# Patient Record
Sex: Female | Born: 1950 | Race: White | Hispanic: No | Marital: Single | Smoking: Former smoker
Health system: Southern US, Community
[De-identification: ages and names within clinical notes are randomized; demographics above are authoritative.]

---

## 2000-09-06 ENCOUNTER — Encounter: Admission: RE | Admit: 2000-09-06 | Discharge: 2000-09-06 | Payer: Self-pay | Admitting: Internal Medicine

## 2000-09-06 ENCOUNTER — Other Ambulatory Visit: Admission: RE | Admit: 2000-09-06 | Discharge: 2000-09-06 | Payer: Self-pay | Admitting: Internal Medicine

## 2000-09-06 ENCOUNTER — Encounter: Payer: Self-pay | Admitting: Internal Medicine

## 2001-09-05 ENCOUNTER — Ambulatory Visit (HOSPITAL_COMMUNITY): Admission: RE | Admit: 2001-09-05 | Discharge: 2001-09-05 | Payer: Self-pay | Admitting: *Deleted

## 2001-09-05 ENCOUNTER — Encounter (INDEPENDENT_AMBULATORY_CARE_PROVIDER_SITE_OTHER): Payer: Self-pay | Admitting: Specialist

## 2002-11-15 ENCOUNTER — Other Ambulatory Visit: Admission: RE | Admit: 2002-11-15 | Discharge: 2002-11-15 | Payer: Self-pay | Admitting: Endocrinology

## 2002-12-25 ENCOUNTER — Ambulatory Visit (HOSPITAL_COMMUNITY): Admission: RE | Admit: 2002-12-25 | Discharge: 2002-12-25 | Payer: Self-pay | Admitting: *Deleted

## 2002-12-25 ENCOUNTER — Encounter (INDEPENDENT_AMBULATORY_CARE_PROVIDER_SITE_OTHER): Payer: Self-pay | Admitting: Specialist

## 2008-04-14 ENCOUNTER — Emergency Department (HOSPITAL_COMMUNITY): Admission: EM | Admit: 2008-04-14 | Discharge: 2008-04-14 | Payer: Self-pay | Admitting: Emergency Medicine

## 2011-04-15 NOTE — Op Note (Signed)
   NAME:  Veronica Small, Veronica Small                        ACCOUNT NO.:  [id number]   MEDICAL RECORD NO.:  [id number]                   PATIENT TYPE:  AMB   LOCATION:  ENDO                                 FACILITY:  [facility]   PHYSICIAN:  Georgiana Spinner, M.D.                 DATE OF BIRTH:  [birth year]   DATE OF PROCEDURE:  DATE OF DISCHARGE:                                 OPERATIVE REPORT   PROCEDURE:  Upper endoscopy.   INDICATIONS:  GERD.   ANESTHESIA:  Demerol 100 mg, Versed 10 mg.   DESCRIPTION OF PROCEDURE:  With the patient mildly sedated in the left  lateral decubitus position, the Olympus videoscopic endoscope was inserted  into the mouth and passed under direct vision through the esophagus which  appeared normal until it reached the distal esophagus and I could not tell  whether was Barrett's esophagus present but areas that were suspect were  photographed and biopsied.  We then entered into the stomach through a  hiatal hernia, the fundus, body, antrum, duodenal bulb, and second portion  of the duodenum all appeared normal.  From this point, the endoscope was  slowly withdrawn, taking circumferential views of the entire duodenal mucosa  until the endoscope was pulled into the stomach and placed in retroflexion,  viewing the stomach from below.  The endoscope was then straightened and  withdrawn, taking circumferential views of the remaining gastric and  esophageal mucosa.  The patient's vital signs and pulse oximetry remained  stable and the patient tolerated the procedure well without apparent  complications.   FINDINGS:  Questionable Barrett's esophagus, biopsied.   PLAN:  Await biopsy report, the patient will call me for results and follow  up with me as an outpatient.                                               Georgiana Spinner, M.D.    GMO/MEDQ  D:  12/25/2002  T:  12/25/2002  Job:  [id number]

## 2011-04-15 NOTE — Procedures (Signed)
[facility]  Patient:    JASMIN, WINBERRY Visit Number: [id number] MRN: [id number]          Service Type: END Location: ENDO Attending Physician:  Sabino Gasser Proc. Date: 09/05/01 Admit Date:  09/05/2001                             Procedure Report  PROCEDURE:  Upper endoscopy with biopsy.  INDICATIONS:  GERD.  ANESTHESIA:  Demerol 70 mg, Versed 8 mg.  DESCRIPTION OF PROCEDURE:  With the patient mildly sedated in the left lateral decubitus position, the Olympus videoscopic endoscope was inserted into the mouth and passed under direct vision through the esophagus.  The distal esophagus was approached, and there was a question of Barretts, as it had two areas that appeared like flame-like erythema extending cephalad in the esophagus, photographed and biopsied.  We entered into the stomach.  Fundus, body, antrum, duodenal bulb, and second portion of the duodenum were visualized, photographs taken.  From this point, the endoscope was slowly withdrawn, taking circumferential views of the entire duodenal mucosa until the endoscope then pulled back into the stomach and placed in retroflexion to view the stomach from below.  The endoscope was then straightened and pulled withdrawn, taking circumferential views of the gastric and esophageal mucosa, stopping in the stomach where some areas of erythema were photographed and biopsied to rule out gastritis.  The endoscope was withdrawn.  The patients vital signs and pulse oximeter remained stable.  The patient tolerated the procedure well without apparent complications.  FINDINGS:  Question of gastritis and question of Barretts esophagus.  Await biopsy report.  The patient will call me for the results and follow up with me as an outpatient.  Proceed to colonoscopy. Attending Physician:  Sabino Gasser DD:  09/05/01 TD:  09/05/01 Job: [id number]

## 2011-04-15 NOTE — Procedures (Signed)
[facility]  Patient:    Veronica Small, Veronica Small Visit Number: [id number] MRN: [id number]          Service Type: END Location: ENDO Attending Physician:  Sabino Gasser Proc. Date: 09/05/01 Admit Date:  09/05/2001                             Procedure Report  PROCEDURE:  Colonoscopy.  INDICATIONS:  Colon cancer screening.  ANESTHESIA:  Demerol 20, Versed 2 mg.  DESCRIPTION OF PROCEDURE:  With the patient mildly sedated in the left lateral decubitus position, the Olympus videoscopic colonoscope was inserted in the rectum and passed under direct vision to the cecum, identified by the ileocecal valve and appendiceal orifice.  We entered into the terminal ileum which also appeared normal and was photographed.  From this point, the colonoscope was slowly withdrawn taking circumferential views of the entire colonic mucosa, stopping in the rectum which appeared normal on direct and retroflexed view.  The endoscope was straightened and withdrawn.  The patients vital signs and pulse oximeter remained stable.  The patient tolerated the procedure well without apparent complications.  FINDINGS:  Essentially negative colonoscopic examination to the terminal ileum.  PLAN:  Possibly repeat in 5-10 years. Attending Physician:  Sabino Gasser DD:  09/05/01 TD:  09/05/01 Job: [id number]

## 2011-08-24 LAB — DIFFERENTIAL
Basophils Absolute: 0.1
Basophils Relative: 1
Eosinophils Absolute: 0
Eosinophils Relative: 1
Lymphocytes Relative: 34
Lymphs Abs: 1.7
Monocytes Absolute: 0.3
Monocytes Relative: 5
Neutro Abs: 3
Neutrophils Relative %: 59

## 2011-08-24 LAB — CBC
HCT: 37.3
Hemoglobin: 12.6
MCHC: 33.9
MCV: 83.4
Platelets: 198
RBC: 4.47
RDW: 12.8
WBC: 5.1

## 2011-08-24 LAB — POCT CARDIAC MARKERS
CKMB, poc: 1.4
CKMB, poc: <1 — ABNORMAL LOW
Myoglobin, poc: 34
Myoglobin, poc: 47.8
Operator id: 264031
Operator id: 290111
Troponin i, poc: 0.05
Troponin i, poc: <0.05

## 2011-08-24 LAB — POCT I-STAT, CHEM 8
BUN: 7
Calcium, Ion: 1.12
Chloride: 105
Creatinine, Ser: 1
Glucose, Bld: 89
HCT: 38
Hemoglobin: 12.9
Potassium: 4.3
Sodium: 140
TCO2: 28

## 2016-04-09 ENCOUNTER — Emergency Department (HOSPITAL_COMMUNITY): Payer: No Typology Code available for payment source

## 2016-04-09 ENCOUNTER — Emergency Department (HOSPITAL_COMMUNITY)
Admission: EM | Admit: 2016-04-09 | Discharge: 2016-04-09 | Disposition: A | Discharge status: Home / Self Care | Payer: No Typology Code available for payment source | Attending: Emergency Medicine | Admitting: Emergency Medicine

## 2016-04-09 ENCOUNTER — Encounter (HOSPITAL_COMMUNITY): Payer: Self-pay

## 2016-04-09 DIAGNOSIS — Z79899 Other long term (current) drug therapy: Secondary | ICD-10-CM | POA: Diagnosis not present

## 2016-04-09 DIAGNOSIS — J040 Acute laryngitis: Secondary | ICD-10-CM | POA: Diagnosis not present

## 2016-04-09 DIAGNOSIS — J189 Pneumonia, unspecified organism: Secondary | ICD-10-CM

## 2016-04-09 DIAGNOSIS — R05 Cough: Secondary | ICD-10-CM | POA: Diagnosis present

## 2016-04-09 DIAGNOSIS — Z87891 Personal history of nicotine dependence: Secondary | ICD-10-CM | POA: Diagnosis not present

## 2016-04-09 DIAGNOSIS — J159 Unspecified bacterial pneumonia: Secondary | ICD-10-CM | POA: Diagnosis not present

## 2016-04-09 LAB — CBC WITH DIFFERENTIAL/PLATELET
Basophils Absolute: 0 10*3/uL (ref 0.0–0.1)
Basophils Relative: 0 %
EOS ABS: 0 10*3/uL (ref 0.0–0.7)
EOS PCT: 0 %
HCT: 35.7 % — ABNORMAL LOW (ref 36.0–46.0)
HEMOGLOBIN: 11.1 g/dL — AB (ref 12.0–15.0)
LYMPHS ABS: 1.3 10*3/uL (ref 0.7–4.0)
Lymphocytes Relative: 16 %
MCH: 26.7 pg (ref 26.0–34.0)
MCHC: 31.1 g/dL (ref 30.0–36.0)
MCV: 85.8 fL (ref 78.0–100.0)
MONO ABS: 0.8 10*3/uL (ref 0.1–1.0)
MONOS PCT: 10 %
Neutro Abs: 6 10*3/uL (ref 1.7–7.7)
Neutrophils Relative %: 74 %
PLATELETS: 252 10*3/uL (ref 150–400)
RBC: 4.16 MIL/uL (ref 3.87–5.11)
RDW: 12.5 % (ref 11.5–15.5)
WBC: 8.2 10*3/uL (ref 4.0–10.5)

## 2016-04-09 LAB — BASIC METABOLIC PANEL
Anion gap: 12 (ref 5–15)
BUN: 7 mg/dL (ref 6–20)
CHLORIDE: 103 mmol/L (ref 101–111)
CO2: 24 mmol/L (ref 22–32)
CREATININE: 0.69 mg/dL (ref 0.44–1.00)
Calcium: 8.7 mg/dL — ABNORMAL LOW (ref 8.9–10.3)
GFR calc Af Amer: >60 mL/min (ref >60)
GFR calc non Af Amer: >60 mL/min (ref >60)
Glucose, Bld: 116 mg/dL — ABNORMAL HIGH (ref 65–99)
Potassium: 3.2 mmol/L — ABNORMAL LOW (ref 3.5–5.1)
SODIUM: 139 mmol/L (ref 135–145)

## 2016-04-09 MED ORDER — ALBUTEROL SULFATE HFA 108 (90 BASE) MCG/ACT IN AERS
2.0000 | INHALATION_SPRAY | Freq: Once | RESPIRATORY_TRACT | Status: AC
  Administered 2016-04-09: 2 via RESPIRATORY_TRACT
  Filled 2016-04-09: qty 6.7

## 2016-04-09 MED ORDER — LEVOFLOXACIN 500 MG PO TABS
500.0000 mg | ORAL_TABLET | Freq: Once | ORAL | Status: AC
  Administered 2016-04-09: 500 mg via ORAL
  Filled 2016-04-09: qty 1

## 2016-04-09 MED ORDER — ALBUTEROL SULFATE HFA 108 (90 BASE) MCG/ACT IN AERS: 2.0000 | INHALATION_SPRAY | RESPIRATORY_TRACT | Status: DC | PRN

## 2016-04-09 MED ORDER — DEXAMETHASONE SODIUM PHOSPHATE 10 MG/ML IJ SOLN
10.0000 mg | Freq: Once | INTRAMUSCULAR | Status: AC
  Administered 2016-04-09: 10 mg via INTRAMUSCULAR
  Filled 2016-04-09: qty 1

## 2016-04-09 MED ORDER — LEVOFLOXACIN 500 MG PO TABS: 500.0000 mg | ORAL_TABLET | Freq: Every day | ORAL | Status: DC

## 2016-04-09 NOTE — ED Notes (Signed)
Ambulated patient with pulse ox, NAD noted, maintained O2 sats above 94% throughout ambulation.

## 2016-04-09 NOTE — ED Notes (Signed)
Patient left at this time with all belongings. 

## 2016-04-09 NOTE — ED Provider Notes (Signed)
CSN: [id number]     Arrival date & time 04/09/16  0304 History  By signing my name below, I, Terrance Branch, attest that this documentation has been prepared under the direction and in the presence of Shon Batonourtney F Horton, MD. Electronically Signed: Evon Slackerrance Branch, ED Scribe. 04/09/2016. 3:41 AM.     Chief Complaint  Patient presents with  . Cough  . Shortness of Breath    The history is provided by the patient. No language interpreter was used.   HPI Comments: Veronica Small is a 65 y.o. female who presents to the Emergency Department complaining of dry cough onset 3 days prior. Pt reports associated SOB, sore throat, fever and voice change. Pt states she was seen by her PCP 3 days prior. States she was diagnosed with URI. States she was prescribed tessalon perles with no relief. Pt denies n/v/d. Pt states she did receive flu vaccination this year. Denies tobacco use. Denies Hx of asthma or COPD  History reviewed. No pertinent past medical history. History reviewed. No pertinent past surgical history. History reviewed. No pertinent family history. Social History  Substance Use Topics  . Smoking status: Former Smoker    Quit date: 04/09/2014  . Smokeless tobacco: None  . Alcohol Use: No   OB History    No data available      Review of Systems  Constitutional: Positive for fever.  HENT: Positive for sore throat and voice change.   Respiratory: Positive for cough and shortness of breath.   Gastrointestinal: Negative for nausea, vomiting and diarrhea.  All other systems reviewed and are negative.    Allergies  Review of patient's allergies indicates no known allergies.  Home Medications   Prior to Admission medications   Medication Sig Start Date End Date Taking? Authorizing Provider  ALPRAZolam Prudy Feeler(XANAX) 0.5 MG tablet Take 0.5 mg by mouth 3 (three) times daily.   Yes Historical Provider, MD  Cholecalciferol (VITAMIN D PO) Take 1 tablet by mouth daily.   Yes Historical  Provider, MD  Omega-3 Fatty Acids (FISH OIL PO) Take 1 tablet by mouth daily.   Yes Historical Provider, MD  omeprazole (PRILOSEC) 20 MG capsule Take 20 mg by mouth daily.   Yes Historical Provider, MD  pravastatin (PRAVACHOL) 40 MG tablet Take 40 mg by mouth daily.   Yes Historical Provider, MD  traZODone (DESYREL) 50 MG tablet Take 50 mg by mouth at bedtime.   Yes Historical Provider, MD  albuterol (PROVENTIL HFA;VENTOLIN HFA) 108 (90 Base) MCG/ACT inhaler Inhale 2 puffs into the lungs every 4 (four) hours as needed for wheezing or shortness of breath. 04/09/16   Shon Batonourtney F Horton, MD  levofloxacin (LEVAQUIN) 500 MG tablet Take 1 tablet (500 mg total) by mouth daily. 04/09/16   Shon Batonourtney F Horton, MD   BP 121/60 mmHg  Pulse 83  Temp(Src) 99.1 F (37.3 C) (Oral)  Resp 20  Ht 5\' 6"  (1.676 m)  Wt 160 lb (72.576 kg)  BMI 25.84 kg/m2  SpO2 95%   Physical Exam  Constitutional: She is oriented to person, place, and time. She appears well-developed and well-nourished. No distress.  HENT:  Head: Normocephalic and atraumatic.  Mouth/Throat: No oropharyngeal exudate.  Mild posterior oropharyngeal swelling, no significant erythema, uvula midline, worse voice  Eyes: Pupils are equal, round, and reactive to light.  Neck: Neck supple.  Cardiovascular: Normal rate, regular rhythm and normal heart sounds.   No murmur heard. Pulmonary/Chest: Effort normal and breath sounds normal. No stridor. No respiratory  distress. She has no wheezes.  Abdominal: Soft. Bowel sounds are normal. There is no tenderness. There is no rebound.  Musculoskeletal: She exhibits no edema.  Lymphadenopathy:    She has cervical adenopathy.  Neurological: She is alert and oriented to person, place, and time.  Skin: Skin is warm and dry.  Psychiatric: She has a normal mood and affect.  Nursing note and vitals reviewed.   ED Course  Procedures (including critical care time) DIAGNOSTIC STUDIES: Oxygen Saturation is 97% on  RA, normal by my interpretation.    COORDINATION OF CARE: 3:41 AM-Discussed treatment plan with pt at bedside and pt agreed to plan.     Labs Review Labs Reviewed  CBC WITH DIFFERENTIAL/PLATELET - Abnormal; Notable for the following:    Hemoglobin 11.1 (*)    HCT 35.7 (*)    All other components within normal limits  BASIC METABOLIC PANEL - Abnormal; Notable for the following:    Potassium 3.2 (*)    Glucose, Bld 116 (*)    Calcium 8.7 (*)    All other components within normal limits    Imaging Review Dg Chest 2 View  04/09/2016  CLINICAL DATA:  Nonproductive cough, fever, dyspnea for 1 week. EXAM: CHEST  2 VIEW COMPARISON:  02/15/2011 FINDINGS: There is patchy airspace consolidation in the right middle lobe, likely pneumonia. The left lung is clear. The pulmonary vasculature is normal. There is no pleural effusion. Hilar and mediastinal contours are unremarkable. IMPRESSION: Right middle lobe pneumonia. Followup PA and lateral chest X-ray is recommended in 3-4 weeks following trial of antibiotic therapy to ensure resolution and exclude underlying malignancy. Electronically Signed   By: Ellery Plunk M.D.   On: 04/09/2016 03:37      EKG Interpretation   Date/Time:  Saturday Apr 09 2016 03:16:56 EDT Ventricular Rate:  83 PR Interval:  140 QRS Duration: 85 QT Interval:  401 QTC Calculation: 471 R Axis:   53 Text Interpretation:  Sinus rhythm Baseline wander in lead(s) I II aVR  Confirmed by HORTON  MD, COURTNEY ([id number]) on 04/09/2016 3:22:42 AM      MDM   Final diagnoses:  Community acquired pneumonia  Laryngitis   Patient presents with persistent cough and hoarse voice. Nontoxic on exam. Afebrile. Vital signs reassuring. She does have mild tonsillar enlargement but no tonsillar exudate. Obvious laryngitis. Breath sounds are clear. Patient was given an inhaler given her history of smoking. She was also given Decadron for bronchospasm and mild tonsillar swelling. Chest  x-ray does show evidence of developing pneumonia. Patient was given a dose of Levaquin. She was able to ambulate and maintain pulse ox greater than 94%. Will discharge with outpatient management.  After history, exam, and medical workup I feel the patient has been appropriately medically screened and is safe for discharge home. Pertinent diagnoses were discussed with the patient. Patient was given return precautions.  I personally performed the services described in this documentation, which was scribed in my presence. The recorded information has been reviewed and is accurate.      Shon Baton, MD 04/09/16 [id number]

## 2016-04-09 NOTE — ED Notes (Signed)
Pt seen by PCP on Tuesday. Dx with viral infection. Pt complaining of shortness of breath and cough.

## 2016-04-09 NOTE — ED Notes (Signed)
Patient transported to X-ray 

## 2016-04-09 NOTE — Discharge Instructions (Signed)
Community-Acquired Pneumonia, Adult °Pneumonia is an infection of the lungs. One type of pneumonia can happen while a person is in a hospital. A different type can happen when a person is not in a hospital (community-acquired pneumonia). It is easy for this kind to spread from person to person. It can spread to you if you breathe near an infected person who coughs or sneezes. Some symptoms include: °· A dry cough. °· A wet (productive) cough. °· Fever. °· Sweating. °· Chest pain. °HOME CARE °· Take over-the-counter and prescription medicines only as told by your doctor. °· Only take cough medicine if you are losing sleep. °· If you were prescribed an antibiotic medicine, take it as told by your doctor. Do not stop taking the antibiotic even if you start to feel better. °· Sleep with your head and neck raised (elevated). You can do this by putting a few pillows under your head, or you can sleep in a recliner. °· Do not use tobacco products. These include cigarettes, chewing tobacco, and e-cigarettes. If you need help quitting, ask your doctor. °· Drink enough water to keep your pee (urine) clear or pale yellow. °A shot (vaccine) can help prevent pneumonia. Shots are often suggested for: °· People older than 65 years of age. °· People older than 65 years of age: °· Who are having cancer treatment. °· Who have long-term (chronic) lung disease. °· Who have problems with their body's defense system (immune system). °You may also prevent pneumonia if you take these actions: °· Get the flu (influenza) shot every year. °· Go to the dentist as often as told. °· Wash your hands often. If soap and water are not available, use hand sanitizer. °GET HELP IF: °· You have a fever. °· You lose sleep because your cough medicine does not help. °GET HELP RIGHT AWAY IF: °· You are short of breath and it gets worse. °· You have more chest pain. °· Your sickness gets worse. This is very serious if: °· You are an older adult. °· Your  body's defense system is weak. °· You cough up blood. °  °This information is not intended to replace advice given to you by your health care provider. Make sure you discuss any questions you have with your health care provider. °  °Document Released: 05/02/2008 Document Revised: 08/05/2015 Document Reviewed: 03/11/2015 °Elsevier Interactive Patient Education ©2016 [employer] ° °Laryngitis °Laryngitis is inflammation of your vocal cords. This causes hoarseness, coughing, loss of voice, sore throat, or a dry throat. Your vocal cords are two bands of muscles that are found in your throat. When you speak, these cords come together and vibrate. These vibrations come out through your mouth as sound. When your vocal cords are inflamed, your voice sounds different. °Laryngitis can be temporary (acute) or long-term (chronic). Most cases of acute laryngitis improve with time. Chronic laryngitis is laryngitis that lasts for more than three weeks. °CAUSES °Acute laryngitis may be caused by: °· A viral infection. °· Lots of talking, yelling, or singing. This is also called vocal strain. °· Bacterial infections. °Chronic laryngitis may be caused by: °· Vocal strain. °· Injury to your vocal cords. °· Acid reflux (gastroesophageal reflux disease or GERD). °· Allergies. °· Sinus infection. °· Smoking. °· Alcohol abuse. °· Breathing in chemicals or dust. °· Growths on the vocal cords. °RISK FACTORS °Risk factors for laryngitis include: °· Smoking. °· Alcohol abuse. °· Having allergies. °SIGNS AND SYMPTOMS °Symptoms of laryngitis may include: °· Low, hoarse   voice. °· Loss of voice. °· Dry cough. °· Sore throat. °· Stuffy nose. °DIAGNOSIS °Laryngitis may be diagnosed by: °· Physical exam. °· Throat culture. °· Blood test. °· Laryngoscopy. This procedure allows your health care provider to look at your vocal cords with a mirror or viewing tube. °TREATMENT °Treatment for laryngitis depends on what is causing it. Usually, treatment  involves resting your voice and using medicines to soothe your throat. However, if your laryngitis is caused by a bacterial infection, you may need to take antibiotic medicine. If your laryngitis is caused by a growth, you may need to have a procedure to remove it. °HOME CARE INSTRUCTIONS °· Drink enough fluid to keep your urine clear or pale yellow. °· Breathe in moist air. Use a humidifier if you live in a dry climate. °· Take medicines only as directed by your health care provider. °· If you were prescribed an antibiotic medicine, finish it all even if you start to feel better. °· Do not smoke cigarettes or electronic cigarettes. If you need help quitting, ask your health care provider. °· Talk as little as possible. Also avoid whispering, which can cause vocal strain. °· Write instead of talking. Do this until your voice is back to normal. °SEEK MEDICAL CARE IF: °· You have a fever. °· You have increasing pain. °· You have difficulty swallowing. °SEEK IMMEDIATE MEDICAL CARE IF: °· You cough up blood. °· You have trouble breathing. °  °This information is not intended to replace advice given to you by your health care provider. Make sure you discuss any questions you have with your health care provider. °  °Document Released: 11/14/2005 Document Revised: 12/05/2014 Document Reviewed: 04/29/2014 °Elsevier Interactive Patient Education ©2016 Elsevier Inc. ° °

## 2016-04-09 NOTE — ED Notes (Signed)
MD at bedside. 

## 2016-05-12 ENCOUNTER — Ambulatory Visit
Admission: RE | Admit: 2016-05-12 | Discharge: 2016-05-12 | Disposition: A | Discharge status: Home / Self Care | Payer: 59 | Source: Ambulatory Visit | Attending: Internal Medicine | Admitting: Internal Medicine

## 2016-05-12 ENCOUNTER — Other Ambulatory Visit: Payer: Self-pay | Admitting: Internal Medicine

## 2016-05-12 DIAGNOSIS — J189 Pneumonia, unspecified organism: Secondary | ICD-10-CM

## 2017-01-06 ENCOUNTER — Other Ambulatory Visit: Payer: Self-pay | Admitting: Obstetrics & Gynecology

## 2017-01-06 ENCOUNTER — Other Ambulatory Visit (HOSPITAL_COMMUNITY)
Admission: RE | Admit: 2017-01-06 | Discharge: 2017-01-06 | Disposition: A | Discharge status: Home / Self Care | Payer: No Typology Code available for payment source | Source: Ambulatory Visit | Attending: Obstetrics & Gynecology | Admitting: Obstetrics & Gynecology

## 2017-01-06 DIAGNOSIS — Z01419 Encounter for gynecological examination (general) (routine) without abnormal findings: Secondary | ICD-10-CM | POA: Diagnosis present

## 2017-01-06 DIAGNOSIS — Z1151 Encounter for screening for human papillomavirus (HPV): Secondary | ICD-10-CM | POA: Insufficient documentation

## 2017-01-10 LAB — CYTOLOGY - PAP
DIAGNOSIS: NEGATIVE
HPV: NOT DETECTED

## 2017-11-09 ENCOUNTER — Ambulatory Visit
Admission: RE | Admit: 2017-11-09 | Discharge: 2017-11-09 | Disposition: A | Discharge status: Home / Self Care | Payer: No Typology Code available for payment source | Source: Ambulatory Visit | Attending: Internal Medicine | Admitting: Internal Medicine

## 2017-11-09 ENCOUNTER — Other Ambulatory Visit: Payer: Self-pay | Admitting: Internal Medicine

## 2017-11-09 DIAGNOSIS — R059 Cough, unspecified: Secondary | ICD-10-CM

## 2017-11-09 DIAGNOSIS — R05 Cough: Secondary | ICD-10-CM

## 2018-07-24 ENCOUNTER — Other Ambulatory Visit: Payer: Self-pay | Admitting: Internal Medicine

## 2018-07-24 ENCOUNTER — Ambulatory Visit
Admission: RE | Admit: 2018-07-24 | Discharge: 2018-07-24 | Disposition: A | Discharge status: Home / Self Care | Payer: No Typology Code available for payment source | Source: Ambulatory Visit | Attending: Internal Medicine | Admitting: Internal Medicine

## 2018-07-24 DIAGNOSIS — R05 Cough: Secondary | ICD-10-CM

## 2018-07-24 DIAGNOSIS — R059 Cough, unspecified: Secondary | ICD-10-CM

## 2018-12-10 ENCOUNTER — Other Ambulatory Visit: Payer: Self-pay | Admitting: Internal Medicine

## 2018-12-10 DIAGNOSIS — E2839 Other primary ovarian failure: Secondary | ICD-10-CM

## 2018-12-10 DIAGNOSIS — Z1231 Encounter for screening mammogram for malignant neoplasm of breast: Secondary | ICD-10-CM

## 2019-02-05 ENCOUNTER — Inpatient Hospital Stay: Admission: RE | Admit: 2019-02-05 | Payer: No Typology Code available for payment source | Source: Ambulatory Visit

## 2019-02-05 ENCOUNTER — Other Ambulatory Visit: Payer: No Typology Code available for payment source

## 2019-12-30 ENCOUNTER — Other Ambulatory Visit: Payer: Self-pay | Admitting: Internal Medicine

## 2019-12-30 DIAGNOSIS — E2839 Other primary ovarian failure: Secondary | ICD-10-CM

## 2020-01-10 ENCOUNTER — Ambulatory Visit: Payer: No Typology Code available for payment source | Attending: Internal Medicine

## 2020-01-10 DIAGNOSIS — Z23 Encounter for immunization: Secondary | ICD-10-CM | POA: Insufficient documentation

## 2020-01-10 NOTE — Progress Notes (Signed)
   Covid-19 Vaccination Clinic  Name:  Veronica Small    MRN: [id number] DOB: 29-May-1951  01/10/2020  [name] was observed post Covid-19 immunization for 15 minutes without incidence. She was provided with Vaccine Information Sheet and instruction to access the V-Safe system.   [name] was instructed to call 911 with any severe reactions post vaccine: Marland Kitchen Difficulty breathing  . Swelling of your face and throat  . A fast heartbeat  . A bad rash all over your body  . Dizziness and weakness    Immunizations Administered    Name Date Dose VIS Date Route   Pfizer COVID-19 Vaccine 01/10/2020  3:54 PM 0.3 mL 11/08/2019 Intramuscular   Manufacturer: [employer], [employer]   Lot: [id number]   NDC: [address]-8

## 2020-01-27 ENCOUNTER — Other Ambulatory Visit: Payer: No Typology Code available for payment source

## 2020-02-03 ENCOUNTER — Ambulatory Visit: Payer: No Typology Code available for payment source | Attending: Internal Medicine

## 2020-02-03 DIAGNOSIS — Z23 Encounter for immunization: Secondary | ICD-10-CM | POA: Insufficient documentation

## 2020-02-03 NOTE — Progress Notes (Signed)
   Covid-19 Vaccination Clinic  Name:  Veronica Small    MRN: [id number] DOB: 06/26/1951  02/03/2020  [name] was observed post Covid-19 immunization for 15 minutes without incident. She was provided with Vaccine Information Sheet and instruction to access the V-Safe system.   [name] was instructed to call 911 with any severe reactions post vaccine: Marland Kitchen Difficulty breathing  . Swelling of face and throat  . A fast heartbeat  . A bad rash all over body  . Dizziness and weakness   Immunizations Administered    Name Date Dose VIS Date Route   Pfizer COVID-19 Vaccine 02/03/2020  8:29 AM 0.3 mL 11/08/2019 Intramuscular   Manufacturer: [employer], [employer]   Lot: [id number]   NDC: [address]-7

## 2021-01-05 ENCOUNTER — Other Ambulatory Visit: Payer: Self-pay | Admitting: Internal Medicine

## 2021-01-05 DIAGNOSIS — E2839 Other primary ovarian failure: Secondary | ICD-10-CM

## 2021-01-05 DIAGNOSIS — Z1231 Encounter for screening mammogram for malignant neoplasm of breast: Secondary | ICD-10-CM

## 2021-06-09 ENCOUNTER — Other Ambulatory Visit: Payer: No Typology Code available for payment source

## 2021-06-09 ENCOUNTER — Ambulatory Visit: Payer: No Typology Code available for payment source

## 2021-11-23 ENCOUNTER — Ambulatory Visit: Payer: No Typology Code available for payment source

## 2021-11-23 ENCOUNTER — Inpatient Hospital Stay: Admission: RE | Admit: 2021-11-23 | Payer: No Typology Code available for payment source | Source: Ambulatory Visit

## 2021-12-20 ENCOUNTER — Other Ambulatory Visit: Payer: Self-pay

## 2021-12-20 ENCOUNTER — Inpatient Hospital Stay (HOSPITAL_COMMUNITY)
Admission: EM | Admit: 2021-12-20 | Discharge: 2021-12-25 | DRG: 853 | Disposition: A | Discharge status: Home / Self Care | Payer: No Typology Code available for payment source | Attending: Internal Medicine | Admitting: Internal Medicine

## 2021-12-20 DIAGNOSIS — I6521 Occlusion and stenosis of right carotid artery: Secondary | ICD-10-CM | POA: Diagnosis present

## 2021-12-20 DIAGNOSIS — W5503XA Scratched by cat, initial encounter: Secondary | ICD-10-CM

## 2021-12-20 DIAGNOSIS — Z823 Family history of stroke: Secondary | ICD-10-CM

## 2021-12-20 DIAGNOSIS — F419 Anxiety disorder, unspecified: Secondary | ICD-10-CM | POA: Diagnosis present

## 2021-12-20 DIAGNOSIS — F32A Depression, unspecified: Secondary | ICD-10-CM | POA: Diagnosis present

## 2021-12-20 DIAGNOSIS — K219 Gastro-esophageal reflux disease without esophagitis: Secondary | ICD-10-CM | POA: Diagnosis present

## 2021-12-20 DIAGNOSIS — Z7902 Long term (current) use of antithrombotics/antiplatelets: Secondary | ICD-10-CM

## 2021-12-20 DIAGNOSIS — D62 Acute posthemorrhagic anemia: Secondary | ICD-10-CM | POA: Diagnosis not present

## 2021-12-20 DIAGNOSIS — I6523 Occlusion and stenosis of bilateral carotid arteries: Secondary | ICD-10-CM | POA: Diagnosis present

## 2021-12-20 DIAGNOSIS — A419 Sepsis, unspecified organism: Secondary | ICD-10-CM | POA: Diagnosis not present

## 2021-12-20 DIAGNOSIS — Z87891 Personal history of nicotine dependence: Secondary | ICD-10-CM

## 2021-12-20 DIAGNOSIS — R29703 NIHSS score 3: Secondary | ICD-10-CM | POA: Diagnosis present

## 2021-12-20 DIAGNOSIS — I6340 Cerebral infarction due to embolism of unspecified cerebral artery: Secondary | ICD-10-CM | POA: Insufficient documentation

## 2021-12-20 DIAGNOSIS — I772 Rupture of artery: Secondary | ICD-10-CM | POA: Diagnosis present

## 2021-12-20 DIAGNOSIS — E8721 Acute metabolic acidosis: Secondary | ICD-10-CM | POA: Diagnosis present

## 2021-12-20 DIAGNOSIS — G8194 Hemiplegia, unspecified affecting left nondominant side: Secondary | ICD-10-CM | POA: Diagnosis present

## 2021-12-20 DIAGNOSIS — G459 Transient cerebral ischemic attack, unspecified: Secondary | ICD-10-CM

## 2021-12-20 DIAGNOSIS — E785 Hyperlipidemia, unspecified: Secondary | ICD-10-CM | POA: Diagnosis present

## 2021-12-20 DIAGNOSIS — Z23 Encounter for immunization: Secondary | ICD-10-CM

## 2021-12-20 DIAGNOSIS — Z7982 Long term (current) use of aspirin: Secondary | ICD-10-CM

## 2021-12-20 DIAGNOSIS — Z8249 Family history of ischemic heart disease and other diseases of the circulatory system: Secondary | ICD-10-CM

## 2021-12-20 DIAGNOSIS — I63411 Cerebral infarction due to embolism of right middle cerebral artery: Secondary | ICD-10-CM | POA: Diagnosis present

## 2021-12-20 DIAGNOSIS — Z79899 Other long term (current) drug therapy: Secondary | ICD-10-CM

## 2021-12-20 DIAGNOSIS — I724 Aneurysm of artery of lower extremity: Secondary | ICD-10-CM | POA: Diagnosis not present

## 2021-12-20 DIAGNOSIS — R739 Hyperglycemia, unspecified: Secondary | ICD-10-CM | POA: Diagnosis present

## 2021-12-20 DIAGNOSIS — I10 Essential (primary) hypertension: Secondary | ICD-10-CM | POA: Diagnosis present

## 2021-12-20 DIAGNOSIS — Z20822 Contact with and (suspected) exposure to covid-19: Secondary | ICD-10-CM | POA: Diagnosis present

## 2021-12-20 DIAGNOSIS — L03113 Cellulitis of right upper limb: Secondary | ICD-10-CM | POA: Diagnosis present

## 2021-12-20 NOTE — ED Triage Notes (Signed)
The patient presents from home due to possible reaction to medication. About a week ago the patient was scratched by her cat. Since then her right forearm has become warm to touch, red and weeping. Today her MD started here on an antibiotic. After her second dose of the antibiotic, she suffered from itching, a near syncopal episode, tremors, slurred speech and facial droop. She believes the slurred speech and facial droop was due to not having her teeth in.    EMS vitals: 260 CBG 92 HR 98% RM 132/76

## 2021-12-21 ENCOUNTER — Inpatient Hospital Stay (HOSPITAL_COMMUNITY): Payer: No Typology Code available for payment source

## 2021-12-21 ENCOUNTER — Emergency Department (HOSPITAL_COMMUNITY): Payer: No Typology Code available for payment source

## 2021-12-21 ENCOUNTER — Encounter (HOSPITAL_COMMUNITY): Payer: Self-pay | Admitting: Emergency Medicine

## 2021-12-21 DIAGNOSIS — Z7902 Long term (current) use of antithrombotics/antiplatelets: Secondary | ICD-10-CM | POA: Diagnosis not present

## 2021-12-21 DIAGNOSIS — I639 Cerebral infarction, unspecified: Secondary | ICD-10-CM | POA: Diagnosis not present

## 2021-12-21 DIAGNOSIS — L03113 Cellulitis of right upper limb: Secondary | ICD-10-CM | POA: Diagnosis not present

## 2021-12-21 DIAGNOSIS — Z8249 Family history of ischemic heart disease and other diseases of the circulatory system: Secondary | ICD-10-CM | POA: Diagnosis not present

## 2021-12-21 DIAGNOSIS — Z23 Encounter for immunization: Secondary | ICD-10-CM | POA: Diagnosis not present

## 2021-12-21 DIAGNOSIS — R739 Hyperglycemia, unspecified: Secondary | ICD-10-CM | POA: Diagnosis not present

## 2021-12-21 DIAGNOSIS — D62 Acute posthemorrhagic anemia: Secondary | ICD-10-CM | POA: Diagnosis not present

## 2021-12-21 DIAGNOSIS — I772 Rupture of artery: Secondary | ICD-10-CM | POA: Diagnosis not present

## 2021-12-21 DIAGNOSIS — A419 Sepsis, unspecified organism: Secondary | ICD-10-CM | POA: Diagnosis present

## 2021-12-21 DIAGNOSIS — I10 Essential (primary) hypertension: Secondary | ICD-10-CM | POA: Diagnosis not present

## 2021-12-21 DIAGNOSIS — I6389 Other cerebral infarction: Secondary | ICD-10-CM | POA: Diagnosis not present

## 2021-12-21 DIAGNOSIS — R29703 NIHSS score 3: Secondary | ICD-10-CM | POA: Diagnosis not present

## 2021-12-21 DIAGNOSIS — Z823 Family history of stroke: Secondary | ICD-10-CM | POA: Diagnosis not present

## 2021-12-21 DIAGNOSIS — W5503XA Scratched by cat, initial encounter: Secondary | ICD-10-CM | POA: Diagnosis not present

## 2021-12-21 DIAGNOSIS — F32A Depression, unspecified: Secondary | ICD-10-CM | POA: Diagnosis not present

## 2021-12-21 DIAGNOSIS — Z87891 Personal history of nicotine dependence: Secondary | ICD-10-CM | POA: Diagnosis not present

## 2021-12-21 DIAGNOSIS — E785 Hyperlipidemia, unspecified: Secondary | ICD-10-CM | POA: Diagnosis not present

## 2021-12-21 DIAGNOSIS — E7800 Pure hypercholesterolemia, unspecified: Secondary | ICD-10-CM | POA: Diagnosis not present

## 2021-12-21 DIAGNOSIS — Z20822 Contact with and (suspected) exposure to covid-19: Secondary | ICD-10-CM | POA: Diagnosis not present

## 2021-12-21 DIAGNOSIS — K219 Gastro-esophageal reflux disease without esophagitis: Secondary | ICD-10-CM | POA: Diagnosis not present

## 2021-12-21 DIAGNOSIS — I724 Aneurysm of artery of lower extremity: Secondary | ICD-10-CM | POA: Diagnosis not present

## 2021-12-21 DIAGNOSIS — I729 Aneurysm of unspecified site: Secondary | ICD-10-CM | POA: Diagnosis not present

## 2021-12-21 DIAGNOSIS — G8194 Hemiplegia, unspecified affecting left nondominant side: Secondary | ICD-10-CM | POA: Diagnosis not present

## 2021-12-21 DIAGNOSIS — Z79899 Other long term (current) drug therapy: Secondary | ICD-10-CM | POA: Diagnosis not present

## 2021-12-21 DIAGNOSIS — I6521 Occlusion and stenosis of right carotid artery: Secondary | ICD-10-CM | POA: Diagnosis not present

## 2021-12-21 DIAGNOSIS — E8721 Acute metabolic acidosis: Secondary | ICD-10-CM | POA: Diagnosis not present

## 2021-12-21 DIAGNOSIS — F419 Anxiety disorder, unspecified: Secondary | ICD-10-CM | POA: Diagnosis not present

## 2021-12-21 DIAGNOSIS — I6523 Occlusion and stenosis of bilateral carotid arteries: Secondary | ICD-10-CM | POA: Diagnosis not present

## 2021-12-21 DIAGNOSIS — I63131 Cerebral infarction due to embolism of right carotid artery: Secondary | ICD-10-CM | POA: Diagnosis not present

## 2021-12-21 DIAGNOSIS — I6340 Cerebral infarction due to embolism of unspecified cerebral artery: Secondary | ICD-10-CM | POA: Insufficient documentation

## 2021-12-21 DIAGNOSIS — I63411 Cerebral infarction due to embolism of right middle cerebral artery: Secondary | ICD-10-CM | POA: Diagnosis not present

## 2021-12-21 DIAGNOSIS — Z7982 Long term (current) use of aspirin: Secondary | ICD-10-CM | POA: Diagnosis not present

## 2021-12-21 LAB — URINALYSIS, ROUTINE W REFLEX MICROSCOPIC
Bilirubin Urine: NEGATIVE
Glucose, UA: 250 mg/dL — AB
Hgb urine dipstick: NEGATIVE
Ketones, ur: NEGATIVE mg/dL
Leukocytes,Ua: NEGATIVE
Nitrite: NEGATIVE
Specific Gravity, Urine: >1.03 — ABNORMAL HIGH (ref 1.005–1.030)
pH: 6 (ref 5.0–8.0)

## 2021-12-21 LAB — ECHOCARDIOGRAM COMPLETE
AR max vel: 2.02 cm2
AV Area VTI: 2.01 cm2
AV Area mean vel: 2.08 cm2
AV Mean grad: 4.5 mmHg
AV Peak grad: 8.4 mmHg
Ao pk vel: 1.45 m/s
Area-P 1/2: 5.09 cm2
Height: 66 in
S' Lateral: 3.1 cm
Weight: 2832 oz

## 2021-12-21 LAB — COMPREHENSIVE METABOLIC PANEL
ALT: 18 U/L (ref 0–44)
AST: 27 U/L (ref 15–41)
Albumin: 3.4 g/dL — ABNORMAL LOW (ref 3.5–5.0)
Alkaline Phosphatase: 77 U/L (ref 38–126)
Anion gap: 9 (ref 5–15)
BUN: 14 mg/dL (ref 8–23)
CO2: 21 mmol/L — ABNORMAL LOW (ref 22–32)
Calcium: 8 mg/dL — ABNORMAL LOW (ref 8.9–10.3)
Chloride: 107 mmol/L (ref 98–111)
Creatinine, Ser: 1.11 mg/dL — ABNORMAL HIGH (ref 0.44–1.00)
GFR, Estimated: 53 mL/min — ABNORMAL LOW (ref >60)
Glucose, Bld: 175 mg/dL — ABNORMAL HIGH (ref 70–99)
Potassium: 3.8 mmol/L (ref 3.5–5.1)
Sodium: 137 mmol/L (ref 135–145)
Total Bilirubin: 0.5 mg/dL (ref 0.3–1.2)
Total Protein: 6.2 g/dL — ABNORMAL LOW (ref 6.5–8.1)

## 2021-12-21 LAB — LACTIC ACID, PLASMA
Lactic Acid, Venous: 3.6 mmol/L (ref 0.5–1.9)
Lactic Acid, Venous: 2.8 mmol/L (ref 0.5–1.9)
Lactic Acid, Venous: 1.1 mmol/L (ref 0.5–1.9)
Lactic Acid, Venous: 1.2 mmol/L (ref 0.5–1.9)

## 2021-12-21 LAB — DIFFERENTIAL
Abs Immature Granulocytes: 0.05 10*3/uL (ref 0.00–0.07)
Basophils Absolute: 0 10*3/uL (ref 0.0–0.1)
Basophils Relative: 0 %
Eosinophils Absolute: 0 10*3/uL (ref 0.0–0.5)
Eosinophils Relative: 0 %
Immature Granulocytes: 0 %
Lymphocytes Relative: 4 %
Lymphs Abs: 0.6 10*3/uL — ABNORMAL LOW (ref 0.7–4.0)
Monocytes Absolute: 0.7 10*3/uL (ref 0.1–1.0)
Monocytes Relative: 5 %
Neutro Abs: 12.6 10*3/uL — ABNORMAL HIGH (ref 1.7–7.7)
Neutrophils Relative %: 91 %

## 2021-12-21 LAB — CBC
HCT: 36.8 % (ref 36.0–46.0)
Hemoglobin: 11.6 g/dL — ABNORMAL LOW (ref 12.0–15.0)
MCH: 27.5 pg (ref 26.0–34.0)
MCHC: 31.5 g/dL (ref 30.0–36.0)
MCV: 87.2 fL (ref 80.0–100.0)
Platelets: 210 10*3/uL (ref 150–400)
RBC: 4.22 MIL/uL (ref 3.87–5.11)
RDW: 12.3 % (ref 11.5–15.5)
WBC: 13.9 10*3/uL — ABNORMAL HIGH (ref 4.0–10.5)
nRBC: 0 % (ref 0.0–0.2)

## 2021-12-21 LAB — APTT: aPTT: 22 seconds — ABNORMAL LOW (ref 24–36)

## 2021-12-21 LAB — RAPID URINE DRUG SCREEN, HOSP PERFORMED
Amphetamines: POSITIVE — AB
Barbiturates: NOT DETECTED
Benzodiazepines: POSITIVE — AB
Cocaine: NOT DETECTED
Opiates: NOT DETECTED
Tetrahydrocannabinol: NOT DETECTED

## 2021-12-21 LAB — PROTIME-INR
INR: 1.1 (ref 0.8–1.2)
Prothrombin Time: 13.9 seconds (ref 11.4–15.2)

## 2021-12-21 LAB — RESP PANEL BY RT-PCR (FLU A&B, COVID) ARPGX2
Influenza A by PCR: NEGATIVE
Influenza B by PCR: NEGATIVE
SARS Coronavirus 2 by RT PCR: NEGATIVE

## 2021-12-21 LAB — URINALYSIS, MICROSCOPIC (REFLEX)
Bacteria, UA: NONE SEEN
RBC / HPF: NONE SEEN RBC/hpf (ref 0–5)

## 2021-12-21 LAB — HIV ANTIBODY (ROUTINE TESTING W REFLEX): HIV Screen 4th Generation wRfx: NONREACTIVE

## 2021-12-21 LAB — ETHANOL: Alcohol, Ethyl (B): <10 mg/dL (ref <10)

## 2021-12-21 LAB — CBG MONITORING, ED: Glucose-Capillary: 133 mg/dL — ABNORMAL HIGH (ref 70–99)

## 2021-12-21 MED ORDER — CLOPIDOGREL BISULFATE 300 MG PO TABS
300.0000 mg | ORAL_TABLET | Freq: Once | ORAL | Status: AC
  Administered 2021-12-21: 10:00:00 300 mg via ORAL
  Filled 2021-12-21: qty 1

## 2021-12-21 MED ORDER — LACTATED RINGERS IV BOLUS (SEPSIS)
1000.0000 mL | Freq: Once | INTRAVENOUS | Status: AC
  Administered 2021-12-21: 02:00:00 1000 mL via INTRAVENOUS

## 2021-12-21 MED ORDER — STROKE: EARLY STAGES OF RECOVERY BOOK
Freq: Once | Status: AC
  Administered 2021-12-21: 18:00:00 1
  Filled 2021-12-21: qty 1

## 2021-12-21 MED ORDER — HYDROCODONE-ACETAMINOPHEN 5-325 MG PO TABS
1.0000 | ORAL_TABLET | ORAL | Status: DC | PRN
  Administered 2021-12-23: 2 via ORAL
  Filled 2021-12-21: qty 2

## 2021-12-21 MED ORDER — IOHEXOL 350 MG/ML SOLN
80.0000 mL | Freq: Once | INTRAVENOUS | Status: AC | PRN
  Administered 2021-12-21: 06:00:00 80 mL via INTRAVENOUS

## 2021-12-21 MED ORDER — ASPIRIN EC 81 MG PO TBEC
81.0000 mg | DELAYED_RELEASE_TABLET | Freq: Every day | ORAL | Status: DC
  Administered 2021-12-21 – 2021-12-23 (×3): 81 mg via ORAL
  Filled 2021-12-21 (×3): qty 1

## 2021-12-21 MED ORDER — FLUOXETINE HCL 20 MG PO CAPS
40.0000 mg | ORAL_CAPSULE | Freq: Every day | ORAL | Status: DC
  Administered 2021-12-21 – 2021-12-25 (×5): 40 mg via ORAL
  Filled 2021-12-21 (×6): qty 2

## 2021-12-21 MED ORDER — ACETAMINOPHEN 650 MG RE SUPP: 650.0000 mg | Freq: Four times a day (QID) | RECTAL | Status: DC | PRN

## 2021-12-21 MED ORDER — CIPROFLOXACIN IN D5W 400 MG/200ML IV SOLN
400.0000 mg | Freq: Once | INTRAVENOUS | Status: AC
  Administered 2021-12-21: 02:00:00 400 mg via INTRAVENOUS
  Filled 2021-12-21: qty 200

## 2021-12-21 MED ORDER — SODIUM CHLORIDE 0.9 % IV SOLN
3.0000 g | Freq: Three times a day (TID) | INTRAVENOUS | Status: DC
  Administered 2021-12-21: 07:00:00 3 g via INTRAVENOUS
  Filled 2021-12-21: qty 8

## 2021-12-21 MED ORDER — CLOPIDOGREL BISULFATE 75 MG PO TABS
75.0000 mg | ORAL_TABLET | Freq: Every day | ORAL | Status: DC
  Administered 2021-12-22: 09:00:00 75 mg via ORAL
  Filled 2021-12-21: qty 1

## 2021-12-21 MED ORDER — LACTATED RINGERS IV SOLN: INTRAVENOUS | Status: AC

## 2021-12-21 MED ORDER — PNEUMOCOCCAL VAC POLYVALENT 25 MCG/0.5ML IJ INJ
0.5000 mL | INJECTION | INTRAMUSCULAR | Status: AC
  Administered 2021-12-22: 11:00:00 0.5 mL via INTRAMUSCULAR
  Filled 2021-12-21: qty 0.5

## 2021-12-21 MED ORDER — SODIUM CHLORIDE 0.9 % IV SOLN
100.0000 mg | Freq: Two times a day (BID) | INTRAVENOUS | Status: DC
  Administered 2021-12-21 – 2021-12-25 (×8): 100 mg via INTRAVENOUS
  Filled 2021-12-21 (×11): qty 100

## 2021-12-21 MED ORDER — PRAVASTATIN SODIUM 40 MG PO TABS
40.0000 mg | ORAL_TABLET | Freq: Every day | ORAL | Status: DC
  Administered 2021-12-21 – 2021-12-25 (×5): 40 mg via ORAL
  Filled 2021-12-21 (×5): qty 1

## 2021-12-21 MED ORDER — SULFAMETHOXAZOLE-TRIMETHOPRIM 800-160 MG PO TABS
1.0000 | ORAL_TABLET | Freq: Once | ORAL | Status: AC
  Administered 2021-12-21: 02:00:00 1 via ORAL
  Filled 2021-12-21: qty 1

## 2021-12-21 MED ORDER — ACETAMINOPHEN 325 MG PO TABS: 650.0000 mg | ORAL_TABLET | Freq: Four times a day (QID) | ORAL | Status: DC | PRN

## 2021-12-21 MED ORDER — ALPRAZOLAM 0.5 MG PO TABS
0.5000 mg | ORAL_TABLET | Freq: Three times a day (TID) | ORAL | Status: DC
  Administered 2021-12-21 – 2021-12-25 (×12): 0.5 mg via ORAL
  Filled 2021-12-21 (×12): qty 1

## 2021-12-21 MED ORDER — PANTOPRAZOLE SODIUM 40 MG PO TBEC
40.0000 mg | DELAYED_RELEASE_TABLET | Freq: Every day | ORAL | Status: DC
  Administered 2021-12-21 – 2021-12-25 (×5): 40 mg via ORAL
  Filled 2021-12-21 (×5): qty 1

## 2021-12-21 MED ORDER — LACTATED RINGERS IV BOLUS (SEPSIS)
250.0000 mL | Freq: Once | INTRAVENOUS | Status: AC
  Administered 2021-12-21: 02:00:00 250 mL via INTRAVENOUS

## 2021-12-21 MED ORDER — SODIUM CHLORIDE (PF) 0.9 % IJ SOLN
INTRAMUSCULAR | Status: AC
  Filled 2021-12-21: qty 50

## 2021-12-21 NOTE — ED Notes (Signed)
Pt back from MRI at this time

## 2021-12-21 NOTE — ED Notes (Signed)
Pharmacy paged regarding pt's Vibramycin.

## 2021-12-21 NOTE — ED Notes (Signed)
Attempted to [facility], [facility] unable to take report at this time. Will re-attempt.  °

## 2021-12-21 NOTE — H&P (Addendum)
History and Physical    Veronica Small GXQ:[id number] DOB: February 07, 1951 DOA: 12/20/2021  PCP: Renford Dills, MD  Patient coming from: Home  Chief Complaint: weakness  HPI: Veronica Small is a 71 y.o. female with medical history significant of anxiety/depression, HLD, GERD. Presenting with LUE weakness. She reports that she was scratched by her cat a couple of days ago. She developed a rash and pain at the wound site, so she went to see her PCP. She was given keflex. She believes she had a reaction to the keflex. Shortly after taking it, she felt weak all over. She felt like her left arm was numb and heavy. She would it difficult to move overall. She became concerned and came to the ED for help.   ED Course: Teleneuro was consulted. Rec'd admission to [facility] and stroke w/u. She was started on cipro, bactrim and unasyn for sepsis. TRH was called for admission.  Review of Systems: Review of systems is otherwise negative for all not mentioned in HPI.   PMHx Anxiety/Depression GERD HLD  PSHx C-section  SocHx  reports that she quit smoking about 7 years ago. Her smoking use included cigarettes. She does not have any smokeless tobacco history on file. She reports that she does not drink alcohol and does not use drugs.  No Known Allergies  FamHx History reviewed. No pertinent family history.  Prior to Admission medications   Medication Sig Start Date End Date Taking? Authorizing Provider  ALPRAZolam Prudy Feeler) 0.5 MG tablet Take 0.5 mg by mouth 3 (three) times daily.   Yes [provider]  cephALEXin (KEFLEX) 500 MG capsule Take 500 mg by mouth 3 (three) times daily. 12/20/21  Yes [provider]  FLUoxetine (PROZAC) 40 MG capsule Take 40 mg by mouth daily. 11/16/21  Yes [provider]  omeprazole (PRILOSEC) 20 MG capsule Take 20 mg by mouth daily.   Yes [provider]  pravastatin (PRAVACHOL) 40 MG tablet Take 40 mg by mouth daily.   Yes [provider]  traZODone (DESYREL) 150 MG tablet Take 300 mg by mouth at bedtime.   Yes [provider]  ARIPiprazole (ABILIFY) 5 MG tablet Take 5 mg by mouth daily. Patient not taking: Reported on 12/21/2021 12/12/21   [provider]    Physical Exam: Vitals:   12/21/21 0415 12/21/21 0445 12/21/21 0500 12/21/21 0630  BP: 97/63 124/61 (!) 108/48 120/69  Pulse: 78 79 71 78  Resp: (!) 21 14 14 16   Temp:    98.3 F (36.8 C)  TempSrc:    Oral  SpO2: 100% 97% 95% 97%  Weight:      Height:        General: 71 y.o. female resting in bed in NAD Eyes: PERRL, normal sclera ENMT: Nares patent w/o discharge, orophaynx clear, dentition normal, ears w/o discharge/lesions/ulcers Neck: Supple, trachea midline Cardiovascular: RRR, +S1, S2, no m/g/r, equal pulses throughout Respiratory: CTABL, no w/r/r, normal WOB GI: BS+, NDNT, no masses noted, no organomegaly noted MSK: No c/c; RUE erythema, swelling Neuro: A&O x 3, LUE weak grip, LUE 4/5 msk strg Psyc: Appropriate interaction and affect, calm/cooperative  Labs on Admission: I have personally reviewed following labs and imaging studies  CBC: Recent Labs  Lab 12/21/21 0050  WBC 13.9*  NEUTROABS 12.6*  HGB 11.6*  HCT 36.8  MCV 87.2  PLT 210   Basic Metabolic Panel: Recent Labs  Lab 12/21/21 0050  NA 137  K 3.8  CL 107  CO2 21*  GLUCOSE 175*  BUN 14  CREATININE 1.11*  CALCIUM 8.0*   GFR: Estimated Creatinine Clearance: 50.4 mL/min (A) (by C-G formula based on SCr of 1.11 mg/dL (H)). Liver Function Tests: Recent Labs  Lab 12/21/21 0050  AST 27  ALT 18  ALKPHOS 77  BILITOT 0.5  PROT 6.2*  ALBUMIN 3.4*   No results for input(s): LIPASE, AMYLASE in the last 168 hours. No results for input(s): AMMONIA in the last 168 hours. Coagulation Profile: Recent Labs  Lab 12/21/21 0050  INR 1.1   Cardiac Enzymes: No results for input(s): CKTOTAL, CKMB, CKMBINDEX, TROPONINI in the last 168 hours. BNP (last  3 results) No results for input(s): PROBNP in the last 8760 hours. HbA1C: No results for input(s): HGBA1C in the last 72 hours. CBG: No results for input(s): GLUCAP in the last 168 hours. Lipid Profile: No results for input(s): CHOL, HDL, LDLCALC, TRIG, CHOLHDL, LDLDIRECT in the last 72 hours. Thyroid Function Tests: No results for input(s): TSH, T4TOTAL, FREET4, T3FREE, THYROIDAB in the last 72 hours. Anemia Panel: No results for input(s): VITAMINB12, FOLATE, FERRITIN, TIBC, IRON, RETICCTPCT in the last 72 hours. Urine analysis:    Component Value Date/Time   COLORURINE YELLOW 12/21/2021 0200   APPEARANCEUR CLEAR 12/21/2021 0200   LABSPEC >1.030 (H) 12/21/2021 0200   PHURINE 6.0 12/21/2021 0200   GLUCOSEU 250 (A) 12/21/2021 0200   HGBUR NEGATIVE 12/21/2021 0200   BILIRUBINUR NEGATIVE 12/21/2021 0200   KETONESUR NEGATIVE 12/21/2021 0200   PROTEINUR TRACE (A) 12/21/2021 0200   NITRITE NEGATIVE 12/21/2021 0200   LEUKOCYTESUR NEGATIVE 12/21/2021 0200    Radiological Exams on Admission: CT Angio Head W or Wo Contrast  Result Date: 12/21/2021 CLINICAL DATA:  Stroke follow-up.  Near syncope with slurred speech EXAM: CT ANGIOGRAPHY HEAD AND NECK TECHNIQUE: Multidetector CT imaging of the head and neck was performed using the standard protocol during bolus administration of intravenous contrast. Multiplanar CT image reconstructions and MIPs were obtained to evaluate the vascular anatomy. Carotid stenosis measurements (when applicable) are obtained utilizing NASCET criteria, using the distal internal carotid diameter as the denominator. RADIATION DOSE REDUCTION: This exam was performed according to the departmental dose-optimization program which includes automated exposure control, adjustment of the mA and/or kV according to patient size and/or use of iterative reconstruction technique. CONTRAST:  80mL OMNIPAQUE IOHEXOL 350 MG/ML SOLN COMPARISON:  Head CT from earlier today FINDINGS: CTA NECK  FINDINGS Aortic arch: Atheromatous calcification with 3 vessel branching. Right carotid system: Atheromatous plaque, heavily calcified, at the bifurcation/proximal ICA but the lumen cannot be measured/assessed given the degree of laryngeal motion. Flow reducing stenosis is definitely possible. No downstream stenosis or beading. Left carotid system: Atheromatous plaque especially heavy calcification at the bifurcation and proximal ICA. Due to laryngeal artifact the lumen cannot be assessed/measured. Possible flow limiting stenosis. No evidence of dissection. Vertebral arteries: Proximal left subclavian atheromatous plaque. No flow limiting stenosis. Dominant left vertebral artery. Both vertebral arteries are smoothly contoured and widely patent to the dura. Skeleton: Cervical spine degeneration especially affecting left-sided facets. Other neck: No acute finding.  Motion artifact Upper chest: Clear apical lungs. Review of the MIP images confirms the above findings CTA HEAD FINDINGS Anterior circulation: Atheromatous calcification of the carotid siphons. No branch occlusion, beading, or aneurysm. Posterior circulation: Vertebral and basilar arteries are smoothly contoured and widely patent. Left vertebral artery is dominant right vertebral artery ending in PICA. No branch occlusion, beading, or aneurysm. Venous sinuses: Diffusely patent Anatomic variants:  None significant Review of the MIP images confirms the above findings IMPRESSION: 1. No emergent finding. No evidence of vertebrobasilar insufficiency. 2. Most notable atheromatous plaque is at the bilateral carotid bifurcation. Nondiagnostic assessment of associated stenoses given patient motion, flow reducing stenosis could be present on either side and follow-up for carotid Doppler or repeat CTA recommended. Electronically Signed   By: Tiburcio PeaJonathan  Watts M.D.   On: 12/21/2021 06:11   CT HEAD WO CONTRAST  Result Date: 12/21/2021 CLINICAL DATA:  Transient ischemic  attack. EXAM: CT HEAD WITHOUT CONTRAST TECHNIQUE: Contiguous axial images were obtained from the base of the skull through the vertex without intravenous contrast. RADIATION DOSE REDUCTION: This exam was performed according to the departmental dose-optimization program which includes automated exposure control, adjustment of the mA and/or kV according to patient size and/or use of iterative reconstruction technique. COMPARISON:  Head CT dated 01/02/2009. FINDINGS: Brain: Mild age-related atrophy and chronic microvascular ischemic changes. There is no acute intracranial hemorrhage. No mass effect or midline shift. No extra-axial fluid collection. Vascular: No hyperdense vessel or unexpected calcification. Skull: Normal. Negative for fracture or focal lesion. Sinuses/Orbits: There is mucoperiosteal thickening of paranasal sinuses. No air-fluid level. The mastoid air cells are clear. Other: None IMPRESSION: 1. No acute intracranial pathology. 2. Mild age-related atrophy and chronic microvascular ischemic changes. Electronically Signed   By: Elgie CollardArash  Radparvar M.D.   On: 12/21/2021 01:35   CT Angio Neck W and/or Wo Contrast  Result Date: 12/21/2021 CLINICAL DATA:  Stroke follow-up.  Near syncope with slurred speech EXAM: CT ANGIOGRAPHY HEAD AND NECK TECHNIQUE: Multidetector CT imaging of the head and neck was performed using the standard protocol during bolus administration of intravenous contrast. Multiplanar CT image reconstructions and MIPs were obtained to evaluate the vascular anatomy. Carotid stenosis measurements (when applicable) are obtained utilizing NASCET criteria, using the distal internal carotid diameter as the denominator. RADIATION DOSE REDUCTION: This exam was performed according to the departmental dose-optimization program which includes automated exposure control, adjustment of the mA and/or kV according to patient size and/or use of iterative reconstruction technique. CONTRAST:  80mL OMNIPAQUE  IOHEXOL 350 MG/ML SOLN COMPARISON:  Head CT from earlier today FINDINGS: CTA NECK FINDINGS Aortic arch: Atheromatous calcification with 3 vessel branching. Right carotid system: Atheromatous plaque, heavily calcified, at the bifurcation/proximal ICA but the lumen cannot be measured/assessed given the degree of laryngeal motion. Flow reducing stenosis is definitely possible. No downstream stenosis or beading. Left carotid system: Atheromatous plaque especially heavy calcification at the bifurcation and proximal ICA. Due to laryngeal artifact the lumen cannot be assessed/measured. Possible flow limiting stenosis. No evidence of dissection. Vertebral arteries: Proximal left subclavian atheromatous plaque. No flow limiting stenosis. Dominant left vertebral artery. Both vertebral arteries are smoothly contoured and widely patent to the dura. Skeleton: Cervical spine degeneration especially affecting left-sided facets. Other neck: No acute finding.  Motion artifact Upper chest: Clear apical lungs. Review of the MIP images confirms the above findings CTA HEAD FINDINGS Anterior circulation: Atheromatous calcification of the carotid siphons. No branch occlusion, beading, or aneurysm. Posterior circulation: Vertebral and basilar arteries are smoothly contoured and widely patent. Left vertebral artery is dominant right vertebral artery ending in PICA. No branch occlusion, beading, or aneurysm. Venous sinuses: Diffusely patent Anatomic variants: None significant Review of the MIP images confirms the above findings IMPRESSION: 1. No emergent finding. No evidence of vertebrobasilar insufficiency. 2. Most notable atheromatous plaque is at the bilateral carotid bifurcation. Nondiagnostic assessment of associated stenoses given patient motion,  flow reducing stenosis could be present on either side and follow-up for carotid Doppler or repeat CTA recommended. Electronically Signed   By: Tiburcio Pea M.D.   On: 12/21/2021 06:11     EKG: Independently reviewed. Sinus, no st elevations  Assessment/Plan Sepsis secondary to RLE cellulitis     - admitted to inpt, tele @ [facility]     - spoke with pharmacy about abx; ID pharm rec's doxy; will change to doxy at this time; continue fluids.      - follow Bld Cx, UCx     - trend lactic acid  LUE weakness: TIA vs CVA     - CTH, CTA H&N negative     - MRI brain pending     - PT/OT/TOC     - swallow screen; if negative can have DM diet, if positive can consult SLP     - echo     - A1c, lipid panel     - per tele neuro rec: plavix bolus then daily DAPT     - Neuro to follow  Hyperglycemia     - A1c, follow  Anxiety Depression     - resume home regimen  HLD     - resume home regimen  GERD     - protonix  DVT prophylaxis: SCDs  Code Status: FULL  Family Communication: None at bedside  Consults called: Neurology   Status is: Inpatient  Remains inpatient appropriate because: severity of illness  Dionisio Aragones A Ronaldo Miyamoto DO [facility]  If 7PM-7AM, please contact night-coverage [email or web address]  12/21/2021, 7:25 AM

## 2021-12-21 NOTE — Sepsis Progress Note (Addendum)
[facility] following Code Sepsis   Pt did receive abx before 1st set of BC were drawn.

## 2021-12-21 NOTE — ED Notes (Signed)
Attempted to call report to [facility]. No answer. [facility] en route.

## 2021-12-21 NOTE — ED Provider Notes (Signed)
[facility] DEPT Provider Note   CSN: [id number] Arrival date & time: 12/20/21  2226     History  Chief Complaint  Patient presents with   Medication Reaction    Zareena Cambridge Haymon is a 71 y.o. female.  The history is provided by the patient.  Weakness Severity:  Moderate Onset quality:  Sudden Duration:  1 hour Timing:  Constant Progression:  Resolved Chronicity:  New Context: change in medication   Relieved by:  None tried Worsened by:  Nothing Associated symptoms: no abdominal pain, no diarrhea, no fever, no headaches, no shortness of breath and no vomiting   Patient presents for possible medication reaction.  Patient reports that she took 2 doses of Keflex today for cat scratch infection to her right arm.  About 30 minutes after the second dose, she began having weakness in her left arm and left leg.  This lasted about an hour.  She reports she had difficulty holding her phone.  She denies any headache or vision changes. No other allergic type symptoms reported The  symptoms have all improved. She denies history of stroke.  She saw her PCP earlier in the day and was given antibiotics She sustained a cat scratch last week    History reviewed. No pertinent past medical history.  Home Medications Prior to Admission medications   Medication Sig Start Date End Date Taking? Authorizing Provider  ALPRAZolam Duanne Moron) 0.5 MG tablet Take 0.5 mg by mouth 3 (three) times daily.   Yes [provider]  cephALEXin (KEFLEX) 500 MG capsule Take 500 mg by mouth 3 (three) times daily. 12/20/21  Yes [provider]  FLUoxetine (PROZAC) 40 MG capsule Take 40 mg by mouth daily. 11/16/21  Yes [provider]  omeprazole (PRILOSEC) 20 MG capsule Take 20 mg by mouth daily.   Yes [provider]  pravastatin (PRAVACHOL) 40 MG tablet Take 40 mg by mouth daily.   Yes [provider]  traZODone (DESYREL) 150 MG tablet Take  300 mg by mouth at bedtime.   Yes [provider]  ARIPiprazole (ABILIFY) 5 MG tablet Take 5 mg by mouth daily. Patient not taking: Reported on 12/21/2021 12/12/21   [provider]      Allergies    Patient has no known allergies.    Review of Systems   Review of Systems  Constitutional:  Positive for chills and fatigue. Negative for fever.  Eyes:  Negative for visual disturbance.  Respiratory:  Negative for shortness of breath.   Gastrointestinal:  Negative for abdominal pain, diarrhea and vomiting.  Skin:  Positive for color change and wound.  Neurological:  Positive for weakness. Negative for speech difficulty, numbness and headaches.  All other systems reviewed and are negative.  Physical Exam Updated Vital Signs BP (!) 108/48    Pulse 71    Temp 97.8 F (36.6 C) (Oral)    Resp 14    Ht 1.676 m (5\' 6" )    Wt 80.3 kg    SpO2 95%    BMI 28.57 kg/m  Physical Exam CONSTITUTIONAL: Well developed/well nourished HEAD: Normocephalic/atraumatic EYES: EOMI/PERRL, no nystagmus ENMT: Mucous membranes moist NECK: supple no meningeal signs CV: S1/S2 noted, no murmurs/rubs/gallops noted LUNGS: Lungs are clear to auscultation bilaterally, no apparent distress ABDOMEN: soft, nontender, no rebound or guarding GU:no cva tenderness NEURO:Awake/alert, face symmetric, no arm or leg drift is noted Equal 5/5 strength with shoulder abduction, elbow flex/extension, wrist flex/extension in upper extremities  Equal  5/5 strength with hip flexion,knee flex/extension, foot dorsi/plantar flexion Cranial nerves 3/4/5/6/06/05/09/11/12 tested and intact Sensation to light touch intact in all extremities EXTREMITIES: pulses normal, full ROM, cellulitis noted to right upper extremity, no crepitus.  See photo below SKIN: warm, color normal PSYCH: no abnormalities of mood noted     Patient gave verbal permission to utilize photo for medical documentation only The image was not stored on any  personal device ED Results / Procedures / Treatments   Labs (all labs ordered are listed, but only abnormal results are displayed) Labs Reviewed  APTT - Abnormal; Notable for the following components:      Result Value   aPTT 22 (*)    All other components within normal limits  CBC - Abnormal; Notable for the following components:   WBC 13.9 (*)    Hemoglobin 11.6 (*)    All other components within normal limits  DIFFERENTIAL - Abnormal; Notable for the following components:   Neutro Abs 12.6 (*)    Lymphs Abs 0.6 (*)    All other components within normal limits  COMPREHENSIVE METABOLIC PANEL - Abnormal; Notable for the following components:   CO2 21 (*)    Glucose, Bld 175 (*)    Creatinine, Ser 1.11 (*)    Calcium 8.0 (*)    Total Protein 6.2 (*)    Albumin 3.4 (*)    GFR, Estimated 53 (*)    All other components within normal limits  RAPID URINE DRUG SCREEN, HOSP PERFORMED - Abnormal; Notable for the following components:   Benzodiazepines POSITIVE (*)    Amphetamines POSITIVE (*)    All other components within normal limits  URINALYSIS, ROUTINE W REFLEX MICROSCOPIC - Abnormal; Notable for the following components:   Specific Gravity, Urine >1.030 (*)    Glucose, UA 250 (*)    Protein, ur TRACE (*)    All other components within normal limits  LACTIC ACID, PLASMA - Abnormal; Notable for the following components:   Lactic Acid, Venous 3.6 (*)    All other components within normal limits  LACTIC ACID, PLASMA - Abnormal; Notable for the following components:   Lactic Acid, Venous 2.8 (*)    All other components within normal limits  RESP PANEL BY RT-PCR (FLU A&B, COVID) ARPGX2  CULTURE, BLOOD (ROUTINE X 2)  CULTURE, BLOOD (ROUTINE X 2)  ETHANOL  PROTIME-INR  URINALYSIS, MICROSCOPIC (REFLEX)    EKG EKG Interpretation  Date/Time:  Tuesday December 21 2021 01:46:58 EST Ventricular Rate:  79 PR Interval:  147 QRS Duration: 96 QT Interval:  439 QTC  Calculation: 504 R Axis:   65 Text Interpretation: Sinus rhythm Prolonged QT interval Interpretation limited secondary to artifact Confirmed by Ripley Fraise [phone]) on 12/21/2021 2:27:15 AM  Radiology CT HEAD WO CONTRAST  Result Date: 12/21/2021 CLINICAL DATA:  Transient ischemic attack. EXAM: CT HEAD WITHOUT CONTRAST TECHNIQUE: Contiguous axial images were obtained from the base of the skull through the vertex without intravenous contrast. RADIATION DOSE REDUCTION: This exam was performed according to the departmental dose-optimization program which includes automated exposure control, adjustment of the mA and/or kV according to patient size and/or use of iterative reconstruction technique. COMPARISON:  Head CT dated 01/02/2009. FINDINGS: Brain: Mild age-related atrophy and chronic microvascular ischemic changes. There is no acute intracranial hemorrhage. No mass effect or midline shift. No extra-axial fluid collection. Vascular: No hyperdense vessel or unexpected calcification. Skull: Normal. Negative for fracture or focal lesion. Sinuses/Orbits: There is mucoperiosteal thickening of paranasal sinuses.  No air-fluid level. The mastoid air cells are clear. Other: None IMPRESSION: 1. No acute intracranial pathology. 2. Mild age-related atrophy and chronic microvascular ischemic changes. Electronically Signed   By: Anner Crete M.D.   On: 12/21/2021 01:35    Procedures .Critical Care Performed by: Ripley Fraise, MD Authorized by: Ripley Fraise, MD   Critical care provider statement:    Critical care time (minutes):  60   Critical care start time:  12/21/2021 4:00 AM   Critical care end time:  12/21/2021 5:00 AM   Critical care time was exclusive of:  Separately billable procedures and treating other patients   Critical care was necessary to treat or prevent imminent or life-threatening deterioration of the following conditions:  Sepsis and CNS failure or compromise   Critical care was  time spent personally by me on the following activities:  Pulse oximetry, ordering and review of radiographic studies, ordering and review of laboratory studies, ordering and performing treatments and interventions, re-evaluation of patient's condition, discussions with consultants, development of treatment plan with patient or surrogate, examination of patient and evaluation of patient's response to treatment   I assumed direction of critical care for this patient from another provider in my specialty: no     Care discussed with: admitting provider      Medications Ordered in ED Medications  lactated ringers infusion ( Intravenous New Bag/Given 12/21/21 0219)  sodium chloride (PF) 0.9 % injection (has no administration in time range)  iohexol (OMNIPAQUE) 350 MG/ML injection 80 mL (has no administration in time range)  ciprofloxacin (CIPRO) IVPB 400 mg (0 mg Intravenous Stopped 12/21/21 0502)  sulfamethoxazole-trimethoprim (BACTRIM DS) 800-160 MG per tablet 1 tablet (1 tablet Oral Given 12/21/21 0223)  lactated ringers bolus 1,000 mL (0 mLs Intravenous Stopped 12/21/21 0502)    And  lactated ringers bolus 1,000 mL (0 mLs Intravenous Stopped 12/21/21 0502)    And  lactated ringers bolus 250 mL (0 mLs Intravenous Stopped 12/21/21 0502)    ED Course/ Medical Decision Making/ A&P Clinical Course as of 12/21/21 0529  Tue Dec 21, 2021  0009 Patient reports that she started having left-sided weakness after taking an antibiotic.  Her symptoms sound more consistent with a TIA.  No focal weakness is noted at this time.  Stroke work-up has been initiated.  We will also treat the cellulitis of her arm from recent cat scratch.  We will avoid penicillin or cephalosporins for now.  We will also consult teleneurology [DW]  0220 Lactic acid is elevated, code sepsis called [DW]  0227 Seen by telemetry neuro who recommends CT angio head and neck.  This is been ordered.  Patient will require admission [DW]  0528  Patient improved, resting comfortably.  Lactate is improving after IV fluids and antibiotics.  Broad-spectrum antibiotics were ordered that excluded cephalosporins due to possible reaction.  Gram-negative coverage was added as well as Bactrim for possible cat scratch disease.  Discussed with Hal Hope for admission [DW]    Clinical Course User Index [DW] Ripley Fraise, MD                           Medical Decision Making Amount and/or Complexity of Data Reviewed Labs: ordered. Radiology: ordered.  Risk Prescription drug management. Decision regarding hospitalization.   This patient presents to the ED for concern of weakness, this involves an extensive number of treatment options, and is a complaint that carries with it a high risk of  complications and morbidity.  The differential diagnosis includes medication reaction, TIA, stroke, intracranial hemorrhage    Additional history obtained:  Records reviewed Primary Care Documents  Lab Tests: I Ordered, and personally interpreted labs.  The pertinent results include: Leukocytosis  Imaging Studies ordered: I ordered imaging studies including CT scan head I independently visualized and interpreted imaging which showed no acute findings I agree with the radiologist interpretation  Cardiac Monitoring: The patient was maintained on a cardiac monitor.  I personally viewed and interpreted the cardiac monitor which showed an underlying rhythm of:  sinus rhythm  Medicines ordered and prescription drug management: I ordered medication including IV fluids and antibiotics for sepsis Reevaluation of the patient after these medicines showed that the patient    improved   Critical Interventions:       IV fluids and antibiotics  Consultations Obtained: I requested consultation with the admitting physician Dr. Hal Hope and consultant teleneurology, and discussed  findings as well as pertinent plan - they recommend:  Admission  Reevaluation: After the interventions noted above, I reevaluated the patient and found that they have :improved  Complexity of problems addressed: Patients presentation is most consistent with  acute presentation with potential threat to life or bodily function      Disposition: After consideration of the diagnostic results and the patients response to treatment,  I feel that the patent would benefit from admission .            Final Clinical Impression(s) / ED Diagnoses Final diagnoses:  TIA (transient ischemic attack)  Cellulitis of right upper extremity    Rx / DC Orders ED Discharge Orders     None         Ripley Fraise, MD 12/21/21 0530

## 2021-12-21 NOTE — Consult Note (Signed)
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 Admit for MR brain/MRA, tele, stroke workup. Neurology following along.  CT HEAD: Reviewed  Our recommendations are outlined below.  Diagnostic Studies: Recommend MRI brain without contrast Routine MRA head without contrast and MRA Neck with contrast Routine CTA head and neck visualization of vasculature Transthoracic Echo with bubble study, if available  Laboratory Studies: Recommend Lipid panel Hemoglobin A1c TSH  Medications: Bolus with Clopidogrel 300 mg bolus x1 and initiate dual antiplatelet therapy with Aspirin 81 mg daily and Clopidogrel 75 mg daily Statins for LDL goal less than 70 Permissive hypertension, Antihypertensives with prn for first 24-48 hrs post stroke onset. If BP greater than 220/120 give Labetalol IV or Vasotec IV  Nursing Recommendations: Telemetry, IV Fluids, avoid dextrose containing fluids, Maintain euglycemia Neuro checks q4 hrs x 24 hrs and then per shift Head of bed 30 degrees  Consultations: Recommend Speech therapy if failed dysphagia screen Physical therapy/Occupational therapy  DVT Prophylaxis: Choice of Primary Team  Disposition: Neurology will follow   Imaging CT head, no abnormality.  Metrics: [facility] Notification Time: 12/21/2021 00:18:29 Stamp Time: 12/21/2021 00:20:43 Callback Response Time: 12/21/2021 00:26:04   ----------------------------------------------------------------------------------------------------  Chief Complaint: L sided weakness  History of Present Illness: Patient is a 71 year  old Female. [name] Presents to ED with c/o left sided weakness in arm/leg. Symptoms still present. TLKW: 19:00, currently outside the window. 1 AM 1/24.  HCT - pending Not on any antiplatelet/anticoagulation social hx ; quit smoking 8 years prior Fam hx : mother with stroke    Past Medical History:      Hypertension      Hyperlipidemia  Medications:  No Anticoagulant use  No Antiplatelet use Reviewed EMR for current medications  Allergies:  Reviewed  Social History: Smoking: No Alcohol Use: No Drug Use: No  Family History:  There Is Family History Of: CAD There is no family history of premature cerebrovascular disease pertinent to this consultation  ROS : 14 Points Review of Systems was performed and was negative except mentioned in HPI.  Past Surgical History: There Is No Surgical History Contributory To Todays Visit   Examination: BP(132/76), Pulse(99), Blood Glucose(175) 1A: Level of Consciousness - Alert; keenly responsive + 0 1B: Ask Month and Age - Both Questions Right + 0 1C: Blink Eyes & Squeeze Hands - Performs Both Tasks + 0 2: Test Horizontal Extraocular Movements - Normal + 0 3: Test Visual Fields - No Visual Loss + 0 4: Test Facial Palsy (Use Grimace if Obtunded) - Normal symmetry + 0 5A: Test Left Arm Motor Drift - Drift, but doesn't hit bed + 1 5B: Test Right Arm Motor Drift - No Drift for 10 Seconds + 0 6A: Test Left Leg Motor Drift - Drift, but doesn't hit bed + 1 6B: Test Right Leg Motor Drift - No Drift for 5 Seconds + 0 7: Test Limb Ataxia (FNF/Heel-Shin) - No Ataxia + 0 8: Test Sensation - Mild-Moderate Loss: Less Sharp/More Dull + 1 9: Test Language/Aphasia - Normal; No aphasia + 0 10: Test Dysarthria - Normal + 0 11: Test Extinction/Inattention - No  abnormality + 0  NIHSS Score: 3     Patient / Family was informed the Neurology Consult would occur via TeleHealth consult by way of interactive audio and video telecommunications and  consented to receiving care in this manner.  Patient is being evaluated for possible acute neurologic impairment and high probability of imminent or life - threatening deterioration.I spent total of 35 minutes providing care to this patient, including time for face to face visit via telemedicine, review of medical records, imaging studies and discussion of findings with providers, the patient and / or family.   Dr Marry Guan   [facility] [phone]  Case FT:[id number]

## 2021-12-21 NOTE — ED Notes (Signed)
Report sent to [facility].

## 2021-12-21 NOTE — ED Notes (Signed)
[facility] paged for transport at this time.

## 2021-12-21 NOTE — Progress Notes (Signed)
Veronica Small  Code Status: FULL [name] is a 71 y.o. female patient admitted from [facility] ED awake, alert - oriented X4 - no acute distress noted. VSS -  no c/o shortness of breath, no c/o chest pain. Cardiac tele in place. Call light within reach, patient able to voice, and demonstrate understanding. Skin, clean-dry- intact without evidence of bruising, or skin tears. Redness to left arm. No evidence of skin break down noted on exam.  ?  Will cont to eval and treat per MD orders.  Jon Gills, RN  12/21/2021 7:54 PM

## 2021-12-21 NOTE — ED Notes (Signed)
Echo at the bedside °

## 2021-12-21 NOTE — ED Notes (Signed)
This nurse entered the pt's room and the pt stated to this nurse that she would like to change her Code status from DNR, as discussed with Hospitalist, Dr. Marylyn Ishihara, to Full Code. This nurse reviewed the steps of resuscitation with this nurse including CPR, medications, and a "breathing tube with a machine breathing for her." The pt is agreeable to all of the above information. This nurse relayed the above to Hospitalist, Dr. Marylyn Ishihara, and Dr. Marylyn Ishihara verifies pt code status is now Full Code.

## 2021-12-21 NOTE — ED Notes (Signed)
[facility] here to take pt.

## 2021-12-21 NOTE — ED Notes (Signed)
Call placed to teleneurology for doctor to complete the notes per Bebe Shaggy MD request

## 2021-12-21 NOTE — Progress Notes (Signed)
Pharmacy Antibiotic Note  Veronica Small is a 71 y.o. female admitted on 12/20/2021 Pt sustained a cat scratch last week.  Pharmacy has been consulted to dose unasyn for cellulitis  Plan: Unasyn 3gm IV q8h Follow renal function and clinical course  Height: 5\' 6"  (167.6 cm) Weight: 80.3 kg (177 lb) IBW/kg (Calculated) : 59.3  Temp (24hrs), Avg:97.8 F (36.6 C), Min:97.8 F (36.6 C), Max:97.8 F (36.6 C)  Recent Labs  Lab 12/21/21 0050 12/21/21 0254  WBC 13.9*  --   CREATININE 1.11*  --   LATICACIDVEN 3.6* 2.8*    Estimated Creatinine Clearance: 50.4 mL/min (A) (by C-G formula based on SCr of 1.11 mg/dL (H)).    No Known Allergies   Thank you for allowing pharmacy to be a part of this patients care.  12/23/21 RPh 12/21/2021, 5:32 AM

## 2021-12-21 NOTE — ED Notes (Addendum)
Pt ambulated to bathroom with nightshift tech and from bathroom with assistance by this nurse. On the way back from the bathroom, the pt stated she "felt weak like she is going to pass out." Pt assisted back into ED stretcher and re-attached to cardiac monitor x3. Pt hypotensive with a systolic BP in the 80's. Pt trendelenberg'd and NS bolus started at this time. Pt additionally appears to have left-sided facial droop, however, there is some question whether this is d/t the pt's lack of dentures at this time. BG 133. Hospitalist, Dr. Ronaldo Miyamoto notified and aware. No additional recommendations at this time. Will continue to monitor.

## 2021-12-21 NOTE — ED Notes (Signed)
Report given to Devoria Glassing, Charity fundraiser.

## 2021-12-21 NOTE — ED Notes (Signed)
Pt ambulated to restroom without difficulty

## 2021-12-22 ENCOUNTER — Inpatient Hospital Stay (HOSPITAL_COMMUNITY): Payer: No Typology Code available for payment source

## 2021-12-22 ENCOUNTER — Other Ambulatory Visit (HOSPITAL_COMMUNITY): Payer: Self-pay

## 2021-12-22 DIAGNOSIS — I639 Cerebral infarction, unspecified: Secondary | ICD-10-CM

## 2021-12-22 DIAGNOSIS — I63131 Cerebral infarction due to embolism of right carotid artery: Secondary | ICD-10-CM

## 2021-12-22 DIAGNOSIS — A419 Sepsis, unspecified organism: Secondary | ICD-10-CM

## 2021-12-22 DIAGNOSIS — I6523 Occlusion and stenosis of bilateral carotid arteries: Secondary | ICD-10-CM

## 2021-12-22 DIAGNOSIS — E7800 Pure hypercholesterolemia, unspecified: Secondary | ICD-10-CM

## 2021-12-22 LAB — COMPREHENSIVE METABOLIC PANEL
ALT: 17 U/L (ref 0–44)
AST: 21 U/L (ref 15–41)
Albumin: 2.8 g/dL — ABNORMAL LOW (ref 3.5–5.0)
Alkaline Phosphatase: 57 U/L (ref 38–126)
Anion gap: 9 (ref 5–15)
BUN: 8 mg/dL (ref 8–23)
CO2: 20 mmol/L — ABNORMAL LOW (ref 22–32)
Calcium: 8 mg/dL — ABNORMAL LOW (ref 8.9–10.3)
Chloride: 108 mmol/L (ref 98–111)
Creatinine, Ser: 0.91 mg/dL (ref 0.44–1.00)
GFR, Estimated: >60 mL/min (ref >60)
Glucose, Bld: 111 mg/dL — ABNORMAL HIGH (ref 70–99)
Potassium: 3.5 mmol/L (ref 3.5–5.1)
Sodium: 137 mmol/L (ref 135–145)
Total Bilirubin: 0.5 mg/dL (ref 0.3–1.2)
Total Protein: 5.1 g/dL — ABNORMAL LOW (ref 6.5–8.1)

## 2021-12-22 LAB — CBC
HCT: 32.8 % — ABNORMAL LOW (ref 36.0–46.0)
Hemoglobin: 10.7 g/dL — ABNORMAL LOW (ref 12.0–15.0)
MCH: 27.9 pg (ref 26.0–34.0)
MCHC: 32.6 g/dL (ref 30.0–36.0)
MCV: 85.6 fL (ref 80.0–100.0)
Platelets: 173 10*3/uL (ref 150–400)
RBC: 3.83 MIL/uL — ABNORMAL LOW (ref 3.87–5.11)
RDW: 12.6 % (ref 11.5–15.5)
WBC: 5 10*3/uL (ref 4.0–10.5)
nRBC: 0 % (ref 0.0–0.2)

## 2021-12-22 LAB — PROTIME-INR
INR: 1.1 (ref 0.8–1.2)
Prothrombin Time: 13.9 seconds (ref 11.4–15.2)

## 2021-12-22 LAB — LIPID PANEL
Cholesterol: 105 mg/dL (ref 0–200)
HDL: 46 mg/dL (ref >40)
LDL Cholesterol: 46 mg/dL (ref 0–99)
Total CHOL/HDL Ratio: 2.3 RATIO
Triglycerides: 63 mg/dL (ref <150)
VLDL: 13 mg/dL (ref 0–40)

## 2021-12-22 LAB — HEMOGLOBIN A1C
Hgb A1c MFr Bld: 5.9 % — ABNORMAL HIGH (ref 4.8–5.6)
Mean Plasma Glucose: 123 mg/dL

## 2021-12-22 LAB — PROCALCITONIN: Procalcitonin: 0.53 ng/mL

## 2021-12-22 LAB — CORTISOL-AM, BLOOD: Cortisol - AM: 10.6 ug/dL (ref 6.7–22.6)

## 2021-12-22 MED ORDER — IOHEXOL 350 MG/ML SOLN
75.0000 mL | Freq: Once | INTRAVENOUS | Status: AC | PRN
  Administered 2021-12-22: 05:00:00 75 mL via INTRAVENOUS

## 2021-12-22 MED ORDER — TICAGRELOR 90 MG PO TABS: 180.0000 mg | ORAL_TABLET | Freq: Once | ORAL | Status: DC

## 2021-12-22 MED ORDER — TICAGRELOR 90 MG PO TABS
90.0000 mg | ORAL_TABLET | Freq: Two times a day (BID) | ORAL | Status: DC
  Administered 2021-12-22 – 2021-12-23 (×2): 90 mg via ORAL
  Filled 2021-12-22 (×2): qty 1

## 2021-12-22 MED ORDER — VANCOMYCIN HCL IN DEXTROSE 1-5 GM/200ML-% IV SOLN
1000.0000 mg | INTRAVENOUS | Status: AC
  Filled 2021-12-22 (×2): qty 200

## 2021-12-22 MED ORDER — TICAGRELOR 90 MG PO TABS
180.0000 mg | ORAL_TABLET | Freq: Once | ORAL | Status: AC
  Administered 2021-12-22: 15:00:00 180 mg via ORAL
  Filled 2021-12-22: qty 2

## 2021-12-22 MED ORDER — TICAGRELOR 90 MG PO TABS: 90.0000 mg | ORAL_TABLET | Freq: Two times a day (BID) | ORAL | Status: DC

## 2021-12-22 MED ORDER — SODIUM CHLORIDE 0.9 % IV SOLN: INTRAVENOUS | Status: DC

## 2021-12-22 NOTE — Progress Notes (Signed)
PROGRESS NOTE  Veronica Small  DOB: 07-24-1951  PCP: Renford Dills, MD KVQ:[id number]  DOA: 12/20/2021  LOS: 1 day  Hospital Day: 3  Chief Complaint  Patient presents with   Medication Reaction   Brief narrative: Veronica Small is a 71 y.o. female with PMH significant for HLD, anxiety/depression, GERD who presented to the ED on 12/20/2021 with left upper extremity weakness, numbness and heaviness. She apparently was scratched on her right forearm by her cat a couple of days ago after which she developed a rash and pain.  She was started on a course of Keflex by her PCP.  Patient tends to relate her symptoms of weakness to Keflex.  In the ED, teleneurology was consulted CT head did not show any acute intracranial pathology Admitted for stroke work-up Neurology consulted. MRI brain showed numerous small acute microembolic infarcts in the right MCA territory with no sign of hemorrhage or mass-effect.  It also showed chronic small vessel ischemic changes CT angio of head and neck showed bilateral carotid artery stenosis with right proximal ICA narrowing 80 to 90% with the possibility of luminal clot.  Subjective: Patient was seen and examined this morning.  Present elderly Caucasian female.  Sitting up at the edge of the bed.  Not in distress.   Chart reviewed  Assessment/Plan: Acute stroke -Presented with left upper extremity weakness -MRI brain with numerous small acute microembolic infarcts in the right MCA territory -Stroke work-up ongoing -Echo with EF 60 to 65%,, no atrial level shunt. -LDL 46, A1c pending -Prior to admission, patient was on pravastatin 40 mg daily. -Currently on aspirin 81 mg daily, Brilinta and pravastatin. -Pending PT/OT eval  Bilateral carotid disease -With right ICA 80 to 90% stenosis -Vascular surgery consulted.  Noted a plan for carotid stenting tomorrow by neuro IR. -Antiplatelets and statin as above  Sepsis secondary to right upper extremity  cellulitis -Patient was scratched on her right forearm by her cat a couple of days ago after which she developed a rash and pain.  She was started on a course of Keflex by her PCP.   -She presented with leukocytosis, significantly elevated lactic acid level.   -Antibiotics was switched to doxycycline on admission.   -Cellulitis seems to be improving. -No fever, WBC count to repeat tomorrow.  Lactic acid level seem to have normalized -Follow-up blood culture report. Recent Labs  Lab 12/21/21 0050 12/21/21 0254 12/21/21 1644 12/21/21 1843 12/22/21 0345 12/22/21 0526  WBC 13.9*  --   --   --   --  5.0  LATICACIDVEN 3.6* 2.8* 1.1 1.2  --   --   PROCALCITON  --   --   --   --  0.53  --    Hyperlipidemia -Pravastatin  Anxiety/ Depression -Continue Xanax, Prozac, Abilify, trazodone  GERD -PPI  Mobility: Encourage ambulation Living condition: Lives at home Goals of care:   Code Status: Full Code  Nutritional status: Body mass index is 28.57 kg/m.      Diet:  Diet Order             Diet NPO time specified Except for: Sips with Meds  Diet effective midnight           Diet Heart Room service appropriate? Yes; Fluid consistency: Thin  Diet effective now                  DVT prophylaxis:  SCDs Start: 12/21/21 1636   Antimicrobials: Doxycycline Fluid: None Consultants: Neurology  Family Communication: None at bedside  Status is: Inpatient  Continue in-hospital care because: Pending carotid stenting tomorrow Level of care: Telemetry Medical   Dispo: The patient is from: Home              Anticipated d/c is to: Pending carotid stenting              Patient currently is not medically stable to d/c.   Difficult to place patient No     Infusions:   [START ON 12/23/2021] sodium chloride     doxycycline (VIBRAMYCIN) IV 100 mg (12/22/21 1223)   [START ON 12/23/2021] vancomycin      Scheduled Meds:  ALPRAZolam  0.5 mg Oral TID   aspirin EC  81 mg Oral Daily    FLUoxetine  40 mg Oral Daily   pantoprazole  40 mg Oral Daily   pravastatin  40 mg Oral Daily   ticagrelor  90 mg Oral BID    PRN meds: acetaminophen **OR** acetaminophen, HYDROcodone-acetaminophen   Antimicrobials: Anti-infectives (From admission, onward)    Start     Dose/Rate Route Frequency Ordered Stop   12/23/21 0000  vancomycin (VANCOCIN) IVPB 1000 mg/200 mL premix        1,000 mg 200 mL/hr over 60 Minutes Intravenous To Radiology 12/22/21 1422 12/24/21 0000   12/21/21 1000  doxycycline (VIBRAMYCIN) 100 mg in sodium chloride 0.9 % 250 mL IVPB        100 mg 125 mL/hr over 120 Minutes Intravenous Every 12 hours 12/21/21 0933     12/21/21 0600  Ampicillin-Sulbactam (UNASYN) 3 g in sodium chloride 0.9 % 100 mL IVPB  Status:  Discontinued        3 g 200 mL/hr over 30 Minutes Intravenous Every 8 hours 12/21/21 0530 12/21/21 0933   12/21/21 0015  ciprofloxacin (CIPRO) IVPB 400 mg        400 mg 200 mL/hr over 60 Minutes Intravenous  Once 12/21/21 0005 12/21/21 0502   12/21/21 0015  sulfamethoxazole-trimethoprim (BACTRIM DS) 800-160 MG per tablet 1 tablet        1 tablet Oral  Once 12/21/21 0005 12/21/21 0223       Objective: Vitals:   12/22/21 1500 12/22/21 1549  BP: (!) 149/72 (!) 145/70  Pulse: 72 68  Resp: 18 18  Temp:  99.4 F (37.4 C)  SpO2:  97%    Intake/Output Summary (Last 24 hours) at 12/22/2021 1654 Last data filed at 12/22/2021 1223 Gross per 24 hour  Intake 750.61 ml  Output --  Net 750.61 ml   Filed Weights   12/21/21 0147  Weight: 80.3 kg   Weight change:  Body mass index is 28.57 kg/m.   Physical Exam: General exam: Pleasant, elderly Caucasian female.  Not in physical distress Skin: No rashes, lesions or ulcers. HEENT: Atraumatic, normocephalic, no obvious bleeding Lungs: Clear to auscultation bilaterally CVS: Regular rate and rhythm, no murmur GI/Abd soft, nontender, nondistended, bowel sound present CNS: Alert, awake, oriented  x3, Psychiatry: Mood appropriate Extremities: No pedal edema, no calf tenderness.  Right forearm cellulitis improving.  Data Review: I have personally reviewed the laboratory data and studies available.  F/u labs ordered. Unresulted Labs (From admission, onward)     Start     Ordered   12/23/21 0500  CBC with Differential/Platelet  Tomorrow morning,   R       Question:  Specimen collection method  Answer:  Lab=Lab collect   12/22/21 1422   12/23/21 0500  Basic metabolic panel  Tomorrow morning,   R       Question:  Specimen collection method  Answer:  Lab=Lab collect   12/22/21 1422   12/23/21 0500  Protime-INR  Tomorrow morning,   R       Question:  Specimen collection method  Answer:  Lab=Lab collect   12/22/21 1422   12/22/21 0600  Hemoglobin A1c  Once,   R        12/22/21 0600            Signed, Lorin Glass, MD [facility] 12/22/2021

## 2021-12-22 NOTE — Consult Note (Signed)
Chief Complaint: Patient was seen in consultation today for  Chief Complaint  Patient presents with   Medication Reaction    Referring Physician(s): Dr. Roda Shutters  Supervising Physician: Julieanne Cotton  Patient Status: [facility] - In-pt  History of Present Illness: Veronica Small is a 71 y.o. female with a medical history significant for anxiety/depression who presented to the ED with complaints of LUE weakness. She reports she was scratched by her cat a few days ago and she developed a rash with pain at the site. She went to her PCP and was given keflex to which she had an adverse reaction. Shortly after taking it she felt weak with left arm numbness/heaviness and she presented to the ED for evaluation. 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 Additional imaging obtained.  CTA Head/Neck with and without contrast 12/21/21 IMPRESSION: 1. Exam is again complicated by patient motion degrading visualization of the carotid bifurcations, but improved from before and showing definite flow reducing stenosis at both proximal ICA. Worse disease on the right where proximal ICA narrowing measures 80-90% and there may be some luminal clot. 2. 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 No gross progression and no interval hemorrhage.  Neuro Interventional Radiology has been asked to evaluate this patient for an image-guided diagnostic cerebral angiogram with possible intervention, possible stent placement to the right ICA.     History reviewed. No pertinent past medical history.  History reviewed. No pertinent surgical history.  Allergies: Patient has no known allergies.  Medications: Prior to Admission medications   Medication Sig Start Date End Date Taking? Authorizing Provider  ALPRAZolam Prudy Feeler) 0.5 MG tablet Take 0.5 mg by mouth 3 (three) times daily.   Yes  [provider]  cephALEXin (KEFLEX) 500 MG capsule Take 500 mg by mouth 3 (three) times daily. 12/20/21  Yes [provider]  FLUoxetine (PROZAC) 40 MG capsule Take 40 mg by mouth daily. 11/16/21  Yes [provider]  omeprazole (PRILOSEC) 20 MG capsule Take 20 mg by mouth daily.   Yes [provider]  pravastatin (PRAVACHOL) 40 MG tablet Take 40 mg by mouth daily.   Yes [provider]  traZODone (DESYREL) 150 MG tablet Take 300 mg by mouth at bedtime.   Yes [provider]  ARIPiprazole (ABILIFY) 5 MG tablet Take 5 mg by mouth daily. Patient not taking: Reported on 12/21/2021 12/12/21   [provider]     History reviewed. No pertinent family history.  Social History   Socioeconomic History   Marital status: Single    Spouse name: Not on file   Number of children: Not on file   Years of education: Not on file   Highest education level: Not on file  Occupational History   Not on file  Tobacco Use   Smoking status: Former    Types: Cigarettes    Quit date: 04/09/2014    Years since quitting: 7.7   Smokeless tobacco: Not on file  Vaping Use   Vaping Use: Never used  Substance and Sexual Activity   Alcohol use: No   Drug use: No   Sexual activity: Not on file  Other Topics Concern   Not on file  Social History Narrative   Not on file   Social Determinants of Health   Financial Resource Strain: Not on file  Food Insecurity: Not on file  Transportation Needs: Not on file  Physical Activity: Not on file  Stress: Not on file  Social Connections: Not on file    Review of Systems: A 12 point ROS discussed and pertinent positives are indicated in the HPI above.  All other systems are negative.  Review of Systems  Constitutional:  Negative for appetite change and fatigue.  Respiratory:  Negative for cough and shortness of breath.   Cardiovascular:  Negative for chest pain and leg swelling.  Gastrointestinal:   Positive for nausea. Negative for abdominal pain, diarrhea and vomiting.  Skin:  Positive for rash and wound.  Neurological:  Negative for dizziness and headaches.   Vital Signs: BP 137/76 (BP Location: Right Arm)    Pulse 66    Temp 98.5 F (36.9 C) (Oral)    Resp 18    Ht $R'5\' 6"'if$  (1.676 m)    Wt 177 lb (80.3 kg)    SpO2 96%    BMI 28.57 kg/m   Physical Exam Constitutional:      General: She is not in acute distress.    Appearance: She is not ill-appearing.  HENT:     Mouth/Throat:     Mouth: Mucous membranes are moist.     Pharynx: Oropharynx is clear.  Cardiovascular:     Rate and Rhythm: Normal rate and regular rhythm.     Pulses: Normal pulses.     Heart sounds: Normal heart sounds.  Pulmonary:     Effort: Pulmonary effort is normal.     Breath sounds: Normal breath sounds.  Abdominal:     General: Bowel sounds are normal.     Palpations: Abdomen is soft.     Tenderness: There is no abdominal tenderness.  Musculoskeletal:     Right lower leg: No edema.     Left lower leg: No edema.  Skin:    General: Skin is warm and dry.     Findings: Rash present.     Comments: Right forearm red/rash   Neurological:     Mental Status: She is alert and oriented to person, place, and time.    Imaging: CT Angio Head W or Wo Contrast  Result Date: 12/21/2021 CLINICAL DATA:  Stroke follow-up.  Near syncope with slurred speech EXAM: CT ANGIOGRAPHY HEAD AND NECK TECHNIQUE: Multidetector CT imaging of the head and neck was performed using the standard protocol during bolus administration of intravenous contrast. Multiplanar CT image reconstructions and MIPs were obtained to evaluate the vascular anatomy. Carotid stenosis measurements (when applicable) are obtained utilizing NASCET criteria, using the distal internal carotid diameter as the denominator. RADIATION DOSE REDUCTION: This exam was performed according to the departmental dose-optimization program which includes automated exposure  control, adjustment of the mA and/or kV according to patient size and/or use of iterative reconstruction technique. CONTRAST:  50mL OMNIPAQUE IOHEXOL 350 MG/ML SOLN COMPARISON:  Head CT from earlier today FINDINGS: CTA NECK FINDINGS Aortic arch: Atheromatous calcification with 3 vessel branching. Right carotid system: Atheromatous plaque, heavily calcified, at the bifurcation/proximal ICA but the lumen cannot be measured/assessed given the degree of laryngeal motion. Flow reducing stenosis is definitely possible. No downstream stenosis or beading. Left carotid system: Atheromatous plaque especially heavy calcification at the bifurcation and proximal ICA. Due to laryngeal artifact the lumen cannot be assessed/measured. Possible flow limiting stenosis. No evidence of dissection. Vertebral arteries: Proximal left subclavian atheromatous plaque. No flow limiting stenosis. Dominant left vertebral artery. Both vertebral arteries are smoothly contoured and widely patent to the dura.  Skeleton: Cervical spine degeneration especially affecting left-sided facets. Other neck: No acute finding.  Motion artifact Upper chest: Clear apical lungs. Review of the MIP images confirms the above findings CTA HEAD FINDINGS Anterior circulation: Atheromatous calcification of the carotid siphons. No branch occlusion, beading, or aneurysm. Posterior circulation: Vertebral and basilar arteries are smoothly contoured and widely patent. Left vertebral artery is dominant right vertebral artery ending in PICA. No branch occlusion, beading, or aneurysm. Venous sinuses: Diffusely patent Anatomic variants: None significant Review of the MIP images confirms the above findings IMPRESSION: 1. No emergent finding. No evidence of vertebrobasilar insufficiency. 2. Most notable atheromatous plaque is at the bilateral carotid bifurcation. Nondiagnostic assessment of associated stenoses given patient motion, flow reducing stenosis could be present on either  side and follow-up for carotid Doppler or repeat CTA recommended. Electronically Signed   By: Jorje Guild M.D.   On: 12/21/2021 06:11   CT ANGIO HEAD NECK W WO CM  Result Date: 12/22/2021 CLINICAL DATA:  Stroke workup EXAM: CT ANGIOGRAPHY HEAD AND NECK TECHNIQUE: Multidetector CT imaging of the head and neck was performed using the standard protocol during bolus administration of intravenous contrast. Multiplanar CT image reconstructions and MIPs were obtained to evaluate the vascular anatomy. Carotid stenosis measurements (when applicable) are obtained utilizing NASCET criteria, using the distal internal carotid diameter as the denominator. RADIATION DOSE REDUCTION: This exam was performed according to the departmental dose-optimization program which includes automated exposure control, adjustment of the mA and/or kV according to patient size and/or use of iterative reconstruction technique. CONTRAST:  51mL OMNIPAQUE IOHEXOL 350 MG/ML SOLN COMPARISON:  Brain MRI from yesterday FINDINGS: CT HEAD FINDINGS Brain: Known small acute infarcts along the cortex at the high right cerebrum. No hemorrhage, hydrocephalus, or gross progression. Vascular: See below Skull: No acute finding Sinuses: Small presumed retention cyst in the left maxillary sinus. Orbits: Unremarkable Review of the MIP images confirms the above findings CTA NECK FINDINGS Aortic arch: Atheromatous calcifications. Three vessel branching. No acute finding. Right carotid system: Motion artifact again complicates evaluation of the carotid bifurcations. Still, there is better visualization of the bifurcation where there is mixed density plaque with proximal ICA luminal narrowing to less than 1 mm, 80-90% stenosis based on sagittal reformats. Bifurcation is below the right mandibular angle. On axial slices there is a seemingly luminal low-density projection suggesting thrombus, although less apparent on sagittal images. Left carotid system: Motion  artifact limits evaluation of the bifurcation. There is mixed density, heavily calcified plaque the bifurcation with proximal ICA stenosis estimated at over 75%. No ulceration or dissection seen. Vertebral arteries: Proximal subclavian atherosclerosis without flow limiting stenosis. The left vertebral artery is dominant. Skeleton: No emergent finding. Asymmetric left degenerative facet spurring Other neck: No acute finding Upper chest: No acute finding Review of the MIP images confirms the above findings CTA HEAD FINDINGS Anterior circulation: Atheromatous plaque on the carotid siphons. No major branch occlusion, flow limiting stenosis, aneurysm, or vascular malformation. Posterior circulation: Left dominant vertebral artery. Right vertebral artery ends in PICA. No branch occlusion, beading, or aneurysm. Venous sinuses: Patent Anatomic variants: None significant Review of the MIP images confirms the above findings IMPRESSION: Exam is again complicated by patient motion degrading visualization of the carotid bifurcations, but improved from before and showing definite flow reducing stenosis at both proximal ICA. Worse disease on the right where proximal ICA narrowing measures 80-90% and there may be some luminal clot. 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 No gross  progression and no interval hemorrhage. Electronically Signed   By: Jorje Guild M.D.   On: 12/22/2021 05:25   CT HEAD WO CONTRAST  Result Date: 12/21/2021 CLINICAL DATA:  Transient ischemic attack. EXAM: CT HEAD WITHOUT CONTRAST TECHNIQUE: Contiguous axial images were obtained from the base of the skull through the vertex without intravenous contrast. RADIATION DOSE REDUCTION: This exam was performed according to the departmental dose-optimization program which includes automated exposure control, adjustment of the mA and/or kV according to patient size and/or use of iterative reconstruction technique. COMPARISON:  Head CT  dated 01/02/2009. FINDINGS: Brain: Mild age-related atrophy and chronic microvascular ischemic changes. There is no acute intracranial hemorrhage. No mass effect or midline shift. No extra-axial fluid collection. Vascular: No hyperdense vessel or unexpected calcification. Skull: Normal. Negative for fracture or focal lesion. Sinuses/Orbits: There is mucoperiosteal thickening of paranasal sinuses. No air-fluid level. The mastoid air cells are clear. Other: None IMPRESSION: 1. No acute intracranial pathology. 2. Mild age-related atrophy and chronic microvascular ischemic changes. Electronically Signed   By: Anner Crete M.D.   On: 12/21/2021 01:35   CT Angio Neck W and/or Wo Contrast  Result Date: 12/21/2021 CLINICAL DATA:  Stroke follow-up.  Near syncope with slurred speech EXAM: CT ANGIOGRAPHY HEAD AND NECK TECHNIQUE: Multidetector CT imaging of the head and neck was performed using the standard protocol during bolus administration of intravenous contrast. Multiplanar CT image reconstructions and MIPs were obtained to evaluate the vascular anatomy. Carotid stenosis measurements (when applicable) are obtained utilizing NASCET criteria, using the distal internal carotid diameter as the denominator. RADIATION DOSE REDUCTION: This exam was performed according to the departmental dose-optimization program which includes automated exposure control, adjustment of the mA and/or kV according to patient size and/or use of iterative reconstruction technique. CONTRAST:  56mL OMNIPAQUE IOHEXOL 350 MG/ML SOLN COMPARISON:  Head CT from earlier today FINDINGS: CTA NECK FINDINGS Aortic arch: Atheromatous calcification with 3 vessel branching. Right carotid system: Atheromatous plaque, heavily calcified, at the bifurcation/proximal ICA but the lumen cannot be measured/assessed given the degree of laryngeal motion. Flow reducing stenosis is definitely possible. No downstream stenosis or beading. Left carotid system:  Atheromatous plaque especially heavy calcification at the bifurcation and proximal ICA. Due to laryngeal artifact the lumen cannot be assessed/measured. Possible flow limiting stenosis. No evidence of dissection. Vertebral arteries: Proximal left subclavian atheromatous plaque. No flow limiting stenosis. Dominant left vertebral artery. Both vertebral arteries are smoothly contoured and widely patent to the dura. Skeleton: Cervical spine degeneration especially affecting left-sided facets. Other neck: No acute finding.  Motion artifact Upper chest: Clear apical lungs. Review of the MIP images confirms the above findings CTA HEAD FINDINGS Anterior circulation: Atheromatous calcification of the carotid siphons. No branch occlusion, beading, or aneurysm. Posterior circulation: Vertebral and basilar arteries are smoothly contoured and widely patent. Left vertebral artery is dominant right vertebral artery ending in PICA. No branch occlusion, beading, or aneurysm. Venous sinuses: Diffusely patent Anatomic variants: None significant Review of the MIP images confirms the above findings IMPRESSION: 1. No emergent finding. No evidence of vertebrobasilar insufficiency. 2. Most notable atheromatous plaque is at the bilateral carotid bifurcation. Nondiagnostic assessment of associated stenoses given patient motion, flow reducing stenosis could be present on either side and follow-up for carotid Doppler or repeat CTA recommended. Electronically Signed   By: Jorje Guild M.D.   On: 12/21/2021 06:11   MR BRAIN WO CONTRAST  Result Date: 12/21/2021 CLINICAL DATA:  Neuro deficit, acute, stroke suspected. Near syncopal episode.  Tremors. Slurred speech. Facial droop. EXAM: MRI HEAD WITHOUT CONTRAST TECHNIQUE: Multiplanar, multiecho pulse sequences of the brain and surrounding structures were obtained without intravenous contrast. COMPARISON:  CT studies earlier same day.  MRI 01/03/2009. FINDINGS: Brain: No abnormality affects  the brainstem or cerebellum. Left cerebral hemisphere shows minimal chronic small-vessel change of the white matter. Right hemisphere shows numerous acute infarctions affecting the cortical brain in the right middle cerebral artery territory consistent with micro embolic infarctions. No large or confluent infarction. No mass effect or hemorrhage. Mild chronic small-vessel change affects the deep white matter of the right hemisphere as well as the left. No hydrocephalus or extra-axial collection. No mass lesion. Vascular: Major vessels at the base of the brain show flow. 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 No large confluent infarction. No sign of hemorrhage or mass effect. Otherwise, the brain has a good appearance for age, with only mild chronic small-vessel ischemic changes of the cerebral hemispheric white matter. Electronically Signed   By: Nelson Chimes M.D.   On: 12/21/2021 10:09   ECHOCARDIOGRAM COMPLETE  Result Date: 12/21/2021    ECHOCARDIOGRAM REPORT   Patient Name:   SIANNA GAROFANO Date of Exam: 12/21/2021 Medical Rec #:  [id number]        Height:       66.0 in Accession #:    [id number]       Weight:       177.0 lb Date of Birth:  [birth year]        BSA:          1.899 m Patient Age:    22 years         BP:           156/84 mmHg Patient Gender: F                HR:           75 bpm. Exam Location:  Inpatient Procedure: 2D Echo, Cardiac Doppler and Color Doppler Indications:    Stroke  History:        Patient has no prior history of Echocardiogram examinations.                 Risk Factors:Former Smoker.  Sonographer:    Glo Herring Referring Phys: [id number] [place]  1. Left ventricular ejection fraction, by estimation, is 60 to 65%. The left ventricle has normal function. The left ventricle has no regional wall motion abnormalities.  Left ventricular diastolic parameters were normal.  2. Right ventricular systolic function is normal. The right ventricular size is normal. Tricuspid regurgitation signal is inadequate for assessing PA pressure.  3. The mitral valve is normal in structure. No evidence of mitral valve regurgitation. No evidence of mitral stenosis.  4. The aortic valve was not well visualized. Aortic valve regurgitation is not visualized. No aortic stenosis is present. Comparison(s): No prior Echocardiogram. Conclusion(s)/Recommendation(s): No intracardiac source of embolism detected on this transthoracic study. Consider a transesophageal echocardiogram to exclude cardiac source of embolism if clinically indicated. FINDINGS  Left Ventricle: Left ventricular ejection fraction, by estimation, is 60 to 65%. The left ventricle has normal function. The left ventricle has no regional wall motion abnormalities. The left ventricular internal cavity size was normal in size. There is  no left ventricular hypertrophy. Left ventricular diastolic parameters were normal. Right Ventricle: Calcified papillary muscle. The right  ventricular size is normal. No increase in right ventricular wall thickness. Right ventricular systolic function is normal. Tricuspid regurgitation signal is inadequate for assessing PA pressure. Left Atrium: Left atrial size was normal in size. Right Atrium: Right atrial size was normal in size. Pericardium: There is no evidence of pericardial effusion. Mitral Valve: The mitral valve is normal in structure. No evidence of mitral valve regurgitation. No evidence of mitral valve stenosis. Tricuspid Valve: The tricuspid valve is normal in structure. Tricuspid valve regurgitation is not demonstrated. No evidence of tricuspid stenosis. Aortic Valve: The aortic valve was not well visualized. Aortic valve regurgitation is not visualized. No aortic stenosis is present. Aortic valve mean gradient measures 4.5 mmHg. Aortic valve peak  gradient measures 8.4 mmHg. Aortic valve area, by VTI measures 2.01 cm. Pulmonic Valve: The pulmonic valve was normal in structure. Pulmonic valve regurgitation is not visualized. No evidence of pulmonic stenosis. Aorta: The aortic root and ascending aorta are structurally normal, with no evidence of dilitation. Venous: The inferior vena cava was not well visualized. IAS/Shunts: No atrial level shunt detected by color flow Doppler.  LEFT VENTRICLE PLAX 2D LVIDd:         4.70 cm   Diastology LVIDs:         3.10 cm   LV e' medial:    9.46 cm/s LV PW:         1.00 cm   LV E/e' medial:  11.7 LV IVS:        1.00 cm   LV e' lateral:   10.60 cm/s LVOT diam:     1.80 cm   LV E/e' lateral: 10.5 LV SV:         66 LV SV Index:   35 LVOT Area:     2.54 cm  RIGHT VENTRICLE RV S prime:     12.30 cm/s LEFT ATRIUM           Index LA diam:      4.30 cm 2.26 cm/m LA Vol (A2C): 37.4 ml 19.70 ml/m  AORTIC VALVE                     PULMONIC VALVE AV Area (Vmax):    2.02 cm      PV Vmax:       1.13 m/s AV Area (Vmean):   2.08 cm      PV Peak grad:  5.1 mmHg AV Area (VTI):     2.01 cm AV Vmax:           145.00 cm/s AV Vmean:          100.400 cm/s AV VTI:            0.328 m AV Peak Grad:      8.4 mmHg AV Mean Grad:      4.5 mmHg LVOT Vmax:         115.00 cm/s LVOT Vmean:        81.900 cm/s LVOT VTI:          0.259 m LVOT/AV VTI ratio: 0.79  AORTA Ao Root diam: 2.90 cm Ao Asc diam:  3.00 cm MITRAL VALVE MV Area (PHT): 5.09 cm     SHUNTS MV Decel Time: 149 msec     Systemic VTI:  0.26 m MV E velocity: 111.00 cm/s  Systemic Diam: 1.80 cm MV A velocity: 123.00 cm/s MV E/A ratio:  0.90 Rudean Haskell MD Electronically signed by Rudean Haskell MD Signature Date/Time: 12/21/2021/3:03:59 PM  Final    VAS US CAROTID (at [facility] and [facility] only)  Result Date: 12/22/2021 Carotid Arterial Duplex Study Patient Name:  CHESNI VOS  Date of Exam:   12/22/2021 Medical Rec #: [id number]         Accession #:    [id number] Date of Birth:  28-Oct-1951         Patient Gender: F Patient Age:   75 years Exam Location:  [facility] Procedure:      VAS US CAROTID Referring Phys: Cherylann Ratel --------------------------------------------------------------------------------  Indications:       CVA and Carotid artery disease. Risk Factors:      Hyperlipidemia. Comparison Study:  No prior study Performing Technologist: Maudry Mayhew MHA, RDMS, RVT, RDCS  Examination Guidelines: A complete evaluation includes B-mode imaging, spectral Doppler, color Doppler, and power Doppler as needed of all accessible portions of each vessel. Bilateral testing is considered an integral part of a complete examination. Limited examinations for reoccurring indications may be performed as noted.  Right Carotid Findings: +----------+--------+--------+--------+-------------------------+---------+             PSV cm/s EDV cm/s Stenosis Plaque Description        Comments   +----------+--------+--------+--------+-------------------------+---------+  CCA Prox   70       12                                                     +----------+--------+--------+--------+-------------------------+---------+  CCA Distal 66       15                calcific and heterogenous Shadowing  +----------+--------+--------+--------+-------------------------+---------+  ICA Prox   220      71       60-79%   calcific and heterogenous Shadowing  +----------+--------+--------+--------+-------------------------+---------+  ICA Mid    105      26                                                     +----------+--------+--------+--------+-------------------------+---------+  ICA Distal 109      33                                                     +----------+--------+--------+--------+-------------------------+---------+  ECA        453      88       >50%                                          +----------+--------+--------+--------+-------------------------+---------+  +----------+--------+-------+----------------+-------------------+             PSV cm/s EDV cms Describe         Arm Pressure (mmHG)  +----------+--------+-------+----------------+-------------------+  Subclavian 125              Multiphasic, WNL                      +----------+--------+-------+----------------+-------------------+ +---------+--------+--+--------+--+---------+  Vertebral PSV cm/s 59 EDV cm/s 14 Antegrade  +---------+--------+--+--------+--+---------+  Left Carotid Findings: +----------+--------+--------+--------+------------------------------+---------+             PSV cm/s EDV cm/s Stenosis Plaque Description             Comments   +----------+--------+--------+--------+------------------------------+---------+  CCA Prox   93       24                                                          +----------+--------+--------+--------+------------------------------+---------+  CCA Distal 82       21                irregular and calcific         Shadowing  +----------+--------+--------+--------+------------------------------+---------+  ICA Prox   289      74       60-79%   heterogenous, irregular and    Shadowing                                         calcific                                  +----------+--------+--------+--------+------------------------------+---------+  ICA Mid    205      51                                                          +----------+--------+--------+--------+------------------------------+---------+  ICA Distal 90       18                                                          +----------+--------+--------+--------+------------------------------+---------+  ECA        227      23                                                          +----------+--------+--------+--------+------------------------------+---------+ +----------+--------+--------+----------------+-------------------+             PSV cm/s EDV cm/s Describe         Arm Pressure (mmHG)   +----------+--------+--------+----------------+-------------------+  Subclavian 107               Multiphasic, WNL                      +----------+--------+--------+----------------+-------------------+ +---------+--------+--+--------+--+---------+  Vertebral PSV cm/s 93 EDV cm/s 23 Antegrade  +---------+--------+--+--------+--+---------+   Summary: Right Carotid: Velocities in the right ICA are consistent with a 60-79%                stenosis. The ECA appears >50% stenosed. Left  Carotid: Velocities in the left ICA are consistent with a 60-79% stenosis. Vertebrals:  Bilateral vertebral arteries demonstrate antegrade flow. Subclavians: Normal flow hemodynamics were seen in bilateral subclavian              arteries. *See table(s) above for measurements and observations.     Preliminary    VAS [place] LOWER EXTREMITY VENOUS (DVT)  Result Date: 12/22/2021  Lower Venous DVT Study Patient Name:  GLORIE DOWLEN  Date of Exam:   12/22/2021 Medical Rec #: [id number]         Accession #:    [id number] Date of Birth: 26-Apr-1951         Patient Gender: F Patient Age:   76 years Exam Location:  [facility] Procedure:      VAS [place] LOWER EXTREMITY VENOUS (DVT) Referring Phys: Cornelius Moras XU --------------------------------------------------------------------------------  Indications: Stroke.  Comparison Study: No prior study Performing Technologist: Maudry Mayhew MHA, RDMS, RVT, RDCS  Examination Guidelines: A complete evaluation includes B-mode imaging, spectral Doppler, color Doppler, and power Doppler as needed of all accessible portions of each vessel. Bilateral testing is considered an integral part of a complete examination. Limited examinations for reoccurring indications may be performed as noted. The reflux portion of the exam is performed with the patient in reverse Trendelenburg.  +---------+---------------+---------+-----------+----------+--------------+  RIGHT      Compressibility Phasicity Spontaneity Properties Thrombus Aging  +---------+---------------+---------+-----------+----------+--------------+  CFV       Full            Yes       Yes                                    +---------+---------------+---------+-----------+----------+--------------+  SFJ       Full                                                             +---------+---------------+---------+-----------+----------+--------------+  FV Prox   Full                                                             +---------+---------------+---------+-----------+----------+--------------+  FV Mid    Full                                                             +---------+---------------+---------+-----------+----------+--------------+  FV Distal Full                                                             +---------+---------------+---------+-----------+----------+--------------+  PFV       Full                                                             +---------+---------------+---------+-----------+----------+--------------+  POP       Full            Yes       Yes                                    +---------+---------------+---------+-----------+----------+--------------+  PTV       Full                                                             +---------+---------------+---------+-----------+----------+--------------+  PERO      Full                                                             +---------+---------------+---------+-----------+----------+--------------+   +---------+---------------+---------+-----------+----------+--------------+  LEFT      Compressibility Phasicity Spontaneity Properties Thrombus Aging  +---------+---------------+---------+-----------+----------+--------------+  CFV       Full            Yes       Yes                                    +---------+---------------+---------+-----------+----------+--------------+  SFJ       Full                                                              +---------+---------------+---------+-----------+----------+--------------+  FV Prox   Full                                                             +---------+---------------+---------+-----------+----------+--------------+  FV Mid    Full                                                             +---------+---------------+---------+-----------+----------+--------------+  FV Distal Full                                                             +---------+---------------+---------+-----------+----------+--------------+  PFV       Full                                                             +---------+---------------+---------+-----------+----------+--------------+  POP       Full            Yes       Yes                                    +---------+---------------+---------+-----------+----------+--------------+  PTV       Full                                                             +---------+---------------+---------+-----------+----------+--------------+  PERO      Full                                                             +---------+---------------+---------+-----------+----------+--------------+    Summary: RIGHT: - There is no evidence of deep vein thrombosis in the lower extremity.  - No cystic structure found in the popliteal fossa.  LEFT: - There is no evidence of deep vein thrombosis in the lower extremity.  - No cystic structure found in the popliteal fossa.  *See table(s) above for measurements and observations.    Preliminary     Labs:  CBC: Recent Labs    12/21/21 0050 12/22/21 0526  WBC 13.9* 5.0  HGB 11.6* 10.7*  HCT 36.8 32.8*  PLT 210 173    COAGS: Recent Labs    12/21/21 0050 12/22/21 0345  INR 1.1 1.1  APTT 22*  --     BMP: Recent Labs    12/21/21 0050 12/22/21 0345  NA 137 137  K 3.8 3.5  CL 107 108  CO2 21* 20*  GLUCOSE 175* 111*  BUN 14 8  CALCIUM 8.0* 8.0*  CREATININE 1.11* 0.91  GFRNONAA 53* >60    LIVER FUNCTION  TESTS: Recent Labs    12/21/21 0050 12/22/21 0345  BILITOT 0.5 0.5  AST 27 21  ALT 18 17  ALKPHOS 77 57  PROT 6.2* 5.1*  ALBUMIN 3.4* 2.8*    TUMOR MARKERS: No results for input(s): AFPTM, CEA, CA199, CHROMGRNA in the last 8760 hours.  Assessment and Plan:  Right ICA stenosis: Syrah D. Leedy, 71 year old female, is tentatively scheduled for 12/23/21 for an image-guided diagnostic cerebral angiogram with possible intervention, possible stent placement. This will be done under general anesthesia and the patient will be admitted to the Neuro ICU following the procedure. Dr. Estanislado Pandy met with the patient at the bedside to discuss this procedure, the risks and benefits. She is in agreement to proceed. She will be loaded with 180 mg Brilinta now and will receive 90 mg tonight, 90 mg tomorrow morning and then 90 mg BID along with 81 mg aspirin.   Risks and benefits of this procedure were discussed with the patient including, but not limited to bleeding, infection, vascular injury or contrast induced renal failure.  This interventional procedure involves the use of X-rays and because of the nature of the planned procedure, it is possible that we will have prolonged use of X-ray fluoroscopy.  Potential radiation risks to you include (but are not limited to)  the following: - A slightly elevated risk for cancer  several years later in life. This risk is typically less than 0.5% percent. This risk is low in comparison to the normal incidence of human cancer, which is 33% for women and 50% for men according to the [employer]. - Radiation induced injury can include skin redness, resembling a rash, tissue breakdown / ulcers and hair loss (which can be temporary or permanent).   The likelihood of either of these occurring depends on the difficulty of the procedure and whether you are sensitive to radiation due to previous procedures, disease, or genetic conditions.   IF your procedure  requires a prolonged use of radiation, you will be notified and given written instructions for further action.  It is your responsibility to monitor the irradiated area for the 2 weeks following the procedure and to notify your physician if you are concerned that you have suffered a radiation induced injury.    All of the patient's questions were answered, patient is agreeable to proceed. She will be NPO at midnight. AM labs ordered.   Consent signed and in chart.  Thank you for this interesting consult.  I greatly enjoyed meeting Samyrah Tiajah Oyster and look forward to participating in their care.  A copy of this report was sent to the requesting provider on this date.  Electronically Signed: Soyla Dryer, AGACNP-BC [phone] 12/22/2021, 1:30 PM   I spent a total of 20 Minutes    in face to face in clinical consultation, greater than 50% of which was counseling/coordinating care for cerebral angiogram with possible intervention, possible stent

## 2021-12-22 NOTE — Evaluation (Signed)
Speech Language Pathology Evaluation Patient Details Name: Veronica Small MRN: [id number] DOB: 1950-12-30 Today's Date: 12/22/2021 Time: 1140-1156 SLP Time Calculation (min) (ACUTE ONLY): 16 min  Problem List:  Patient Active Problem List   Diagnosis Date Noted   Sepsis (HCC) 12/21/2021   Cerebral embolism with cerebral infarction 12/21/2021   Past Medical History: History reviewed. No pertinent past medical history. Past Surgical History: History reviewed. No pertinent surgical history. HPI:  Pt is a 71 y.o. female who presented with LUE weakness and numbness. 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 PMH: anxiety/depression, HLD, GERD.   Assessment / Plan / Recommendation Clinical Impression  Pt participated in speech/language/cognition evaluation evaluation. She reported that she lives with her ex-husband for financial reasons and was independent prior to admission. She stated that she has an Museum/gallery conservator education and currently works full time sewing. [facility] Mental Status Examination was completed to evaluate the pt's cognitive-linguistic skills. She achieved a score of 24/30 which is slightly below the normal limits of 25 or more out of 30 for her level of education. However, no no significant cognitive-linguistic deficits were demonstrated during informal assessment. No speech or language deficits were observed. Further skilled SLP services are not clinically indicated at this time. Pt was educated regarding this and she verbalized understanding as well as agreement with plan of care.    SLP Assessment  SLP Recommendation/Assessment: Patient does not need any further Speech Lanaguage Pathology Services SLP Visit Diagnosis: Cognitive communication deficit (R41.841)    Recommendations for follow up therapy are one component of a multi-disciplinary discharge planning process, led by the attending physician.  Recommendations may be updated based on patient  status, additional functional criteria and insurance authorization.    Follow Up Recommendations  No SLP follow up    Assistance Recommended at Discharge  None  Functional Status Assessment    Frequency and Duration           SLP Evaluation Cognition  Overall Cognitive Status: Within Functional Limits for tasks assessed Arousal/Alertness: Awake/alert Orientation Level: Oriented X4 Year: 2023 Month: January Day of Week: Correct Attention: Focused;Sustained Focused Attention: Appears intact Sustained Attention: Appears intact Memory: Impaired Memory Impairment: Retrieval deficit (Immediate: 5/5; delayed: 3/5 with cues: 2/2)       Comprehension  Auditory Comprehension Overall Auditory Comprehension: Appears within functional limits for tasks assessed Yes/No Questions: Within Functional Limits Commands: Within Functional Limits    Expression Expression Primary Mode of Expression: Verbal Verbal Expression Overall Verbal Expression: Appears within functional limits for tasks assessed Initiation: No impairment Level of Generative/Spontaneous Verbalization: Conversation Naming: No impairment Written Expression Dominant Hand: Right   Oral / Motor  Oral Motor/Sensory Function Overall Oral Motor/Sensory Function: Within functional limits Motor Speech Overall Motor Speech: Appears within functional limits for tasks assessed Respiration: Within functional limits Phonation: Normal Resonance: Within functional limits Articulation: Within functional limitis Intelligibility: Intelligible Motor Planning: Witnin functional limits Motor Speech Errors: Not applicable           Brendan Gadson I. Vear Clock, MS, CCC-SLP Acute Rehabilitation Services Office number [phone] Pager [phone]  Scheryl Marten 12/22/2021, 12:00 PM

## 2021-12-22 NOTE — Evaluation (Signed)
Occupational Therapy Evaluation Patient Details Name: Veronica Small MRN: [id number] DOB: 12/15/1950 Today's Date: 12/22/2021   History of Present Illness Veronica Small is a 71 y.o. female with medical history significant of anxiety/depression, HLD, GERD.  Reports had cat scratch on R arm developed rash and pain and PCP gave Keflex and reports felt weak after taking it.  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   Clinical Impression   Pt admitted for concerns listed above. PTA Pt reported that she was independent with all ADL's and IADL's, including working full time in a sewing factory. At this time, pt presents with mild incoordination and stiffness/swelling in her LUE. Functionally, pt continues to demonstrate independence with all ADL's and mobility, requiring no assist. Recommending Out Patient OT due to pt having a hands on job that requires good fine motor skills. OT will continue to follow acutely to address LUE incoordination.      Recommendations for follow up therapy are one component of a multi-disciplinary discharge planning process, led by the attending physician.  Recommendations may be updated based on patient status, additional functional criteria and insurance authorization.   Follow Up Recommendations  Outpatient OT    Assistance Recommended at Discharge PRN  Patient can return home with the following      Functional Status Assessment  Patient has had a recent decline in their functional status and demonstrates the ability to make significant improvements in function in a reasonable and predictable amount of time.  Equipment Recommendations  None recommended by OT    Recommendations for Other Services       Precautions / Restrictions Precautions Precautions: Fall Restrictions Weight Bearing Restrictions: No      Mobility Bed Mobility Overal bed mobility: Independent                   Transfers Overall transfer level: Independent Equipment used: None                      Balance Overall balance assessment: Needs assistance   Sitting balance-Leahy Scale: Normal     Standing balance support: No upper extremity supported, During functional activity Standing balance-Leahy Scale: Good                             ADL either performed or assessed with clinical judgement   ADL Overall ADL's : Modified independent                                       General ADL Comments: Pt able to complete all ADL's with no physical assistance.     Vision Baseline Vision/History: 1 Wears glasses Ability to See in Adequate Light: 0 Adequate Patient Visual Report: No change from baseline Vision Assessment?: No apparent visual deficits Additional Comments: Pt reported 2 days ago her L eye lost all vision for a few minutes, before quickly returning. AT this time, tracking, saccade, functional acuity, convergence, and peripheral all appear WFL.     Perception     Praxis      Pertinent Vitals/Pain Pain Assessment Pain Assessment: No/denies pain     Hand Dominance Right   Extremity/Trunk Assessment Upper Extremity Assessment Upper Extremity Assessment: LUE deficits/detail LUE Deficits / Details: Decreased coordination/stiffness, Strength WFL and grossly equal to RUE LUE Sensation:  decreased light touch LUE Coordination: decreased fine motor   Lower Extremity Assessment Lower Extremity Assessment: Defer to PT evaluation   Cervical / Trunk Assessment Cervical / Trunk Assessment: Kyphotic   Communication Communication Communication: No difficulties   Cognition Arousal/Alertness: Awake/alert Behavior During Therapy: WFL for tasks assessed/performed Overall Cognitive Status: Within Functional Limits for tasks assessed                                       General Comments  VSS on RA    Exercises     Shoulder  Instructions      Home Living Family/patient expects to be discharged to:: Private residence Living Arrangements: Non-relatives/Friends (Roommate) Available Help at Discharge: Friend(s);Neighbor Type of Home: Mobile home Home Access: Stairs to enter [employer] of Steps: 4   Home Layout: One level     Bathroom Shower/Tub: Theme park manager: Yes   Home Equipment: None   Additional Comments: works in Product/process development scientist full time, likes to crochet and play [place] with her neighbor      Prior Functioning/Environment Prior Level of Function : Independent/Modified Independent                        OT Problem List: Decreased strength;Decreased coordination;Impaired UE functional use      OT Treatment/Interventions: Self-care/ADL training;Therapeutic exercise;Therapeutic activities    OT Goals(Current goals can be found in the care plan section) Acute Rehab OT Goals Patient Stated Goal: To go back to work OT Goal Formulation: With patient Time For Goal Achievement: 01/05/22 Potential to Achieve Goals: Good ADL Goals Pt/caregiver will Perform Home Exercise Program: Increased strength;Left upper extremity;With theraputty;Independently;With written HEP provided Additional ADL Goal #1: Pt will use LUE for functional tasks 75% of the time, to improve coordination.  OT Frequency: Min 2X/week    Co-evaluation              AM-PAC OT "6 Clicks" Daily Activity     Outcome Measure Help from another person eating meals?: None Help from another person taking care of personal grooming?: None Help from another person toileting, which includes using toliet, bedpan, or urinal?: None Help from another person bathing (including washing, rinsing, drying)?: None Help from another person to put on and taking off regular upper body clothing?: None Help from another person to put on and taking off regular lower body clothing?:  None 6 Click Score: 24   End of Session Equipment Utilized During Treatment: Gait belt Nurse Communication: Mobility status  Activity Tolerance: Patient tolerated treatment well Patient left: in bed;with call bell/phone within reach  OT Visit Diagnosis: Unsteadiness on feet (R26.81);Other abnormalities of gait and mobility (R26.89);Muscle weakness (generalized) (M62.81)                Time: 8657-8469 OT Time Calculation (min): 11 min Charges:  OT General Charges $OT Visit: 1 Visit OT Evaluation $OT Eval Moderate Complexity: 1 Mod  Veronica H., OTR/L Acute Rehabilitation  Veronica Small 12/22/2021, 3:26 PM

## 2021-12-22 NOTE — Evaluation (Signed)
Physical Therapy Evaluation Patient Details Name: Veronica Small MRN: [id number] DOB: 22-Apr-1951 Today's Date: 12/22/2021  History of Present Illness  Clarence Romie Keeble is a 71 y.o. female with medical history significant of anxiety/depression, HLD, GERD.  Reports had cat scratch on R arm developed rash and pain and PCP gave Keflex and reports felt weak after taking it.  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  Clinical Impression  Patient presents with mild gait abnormality she reports is her baseline, but noting R LE weakness affecting gait and stairs, though pt still able to manage safely unaided.  Feel she may benefit from skilled PT while in the acute setting, but will not recommend follow up PT at this time.  She lives with a roommate, though reports feels she should be self sufficient.  Son in the room reports they can help with IADL's if needed.  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  Will follow up if not d/c.        Recommendations for follow up therapy are one component of a multi-disciplinary discharge planning process, led by the attending physician.  Recommendations may be updated based on patient status, additional functional criteria and insurance authorization.  Follow Up Recommendations No PT follow up    Assistance Recommended at Discharge PRN  Patient can return home with the following  Assist for transportation;Assistance with cooking/housework    Equipment Recommendations None recommended by PT  Recommendations for Other Services       Functional Status Assessment Patient has had a recent decline in their functional status and demonstrates the ability to make significant improvements in function in a reasonable and predictable amount of time.     Precautions / Restrictions Precautions Precautions: Fall      Mobility  Bed Mobility Overal bed mobility: Independent                   Transfers Overall transfer level: Independent                      Ambulation/Gait Ambulation/Gait assistance: Independent Gait Distance (Feet): 150 Feet Assistive device: None Gait Pattern/deviations: Step-through pattern, Drifts right/left, Narrow base of support       General Gait Details: mild R LE weakness versus LLD during function noted, pt denies changes in her gait, no assistance needed for ambulaion  Stairs Stairs: Yes Stairs assistance: Modified independent (Device/Increase time) Stair Management: Two rails, Alternating pattern, Forwards Number of Stairs: 4 General stair comments: mild imbalance stepping off shorter stairs, pt denies, but possible due to height difference, educated pt on difference in stair height  Wheelchair Mobility    Modified Rankin (Stroke Patients Only) Modified Rankin (Stroke Patients Only) Pre-Morbid Rankin Score: No symptoms Modified Rankin: Slight disability     Balance Overall balance assessment: Needs assistance   Sitting balance-Leahy Scale: Normal       Standing balance-Leahy Scale: Good Standing balance comment: eyes closed 10 s no LOB, looks over shoulder bilateral no LOB, step taps to 6" step no UE support, mild LOB in SLS on R, but self corrected and denies symptoms again                             Pertinent Vitals/Pain Pain Assessment Pain Assessment: No/denies pain    Home Living Family/patient expects to be discharged to:: Private residence Living Arrangements: Other (Comment);Non-relatives/Friends (Roommate)  Available Help at Discharge: Friend(s);Neighbor Type of Home: Mobile home Home Access: Stairs to enter   Secretary/administrator of Steps: 4   Home Layout: One level Home Equipment: None Additional Comments: works in Product/process development scientist full time, likes to crochet and play [place] with her neighbor    Prior Function Prior Level of Function : Independent/Modified Independent                      Hand Dominance   Dominant Hand: Right    Extremity/Trunk Assessment   Upper Extremity Assessment Upper Extremity Assessment: Overall WFL for tasks assessed (significant bruising/swelling L UE from IV)    Lower Extremity Assessment Lower Extremity Assessment: Overall WFL for tasks assessed       Communication   Communication: No difficulties  Cognition Arousal/Alertness: Awake/alert Behavior During Therapy: WFL for tasks assessed/performed Overall Cognitive Status: Within Functional Limits for tasks assessed                                          General Comments General comments (skin integrity, edema, etc.): educated on fall prevention with footwear, lighting, items used frequently stored between shoulder and hip height, holding onto wall when stepping in tub and taking her time, not rushing.  Also reviewed with son and daughter in law in the room    Exercises     Assessment/Plan    PT Assessment Patient needs continued PT services  PT Problem List Decreased strength;Decreased mobility;Decreased safety awareness;Decreased balance       PT Treatment Interventions Gait training;Therapeutic exercise;Patient/family education;Balance training;Functional mobility training;Therapeutic activities    PT Goals (Current goals can be found in the Care Plan section)  Acute Rehab PT Goals Patient Stated Goal: to return home PT Goal Formulation: With patient/family Time For Goal Achievement: 12/29/21 Potential to Achieve Goals: Good    Frequency Min 4X/week     Co-evaluation               AM-PAC PT "6 Clicks" Mobility  Outcome Measure Help needed turning from your back to your side while in a flat bed without using bedrails?: None Help needed moving from lying on your back to sitting on the side of a flat bed without using bedrails?: None Help needed moving to and from a bed to a chair (including a wheelchair)?: None Help needed  standing up from a chair using your arms (e.g., wheelchair or bedside chair)?: None Help needed to walk in hospital room?: None Help needed climbing 3-5 steps with a railing? : None 6 Click Score: 24    End of Session   Activity Tolerance: Patient tolerated treatment well Patient left: in bed;with call bell/phone within reach;with nursing/sitter in room;with family/visitor present   PT Visit Diagnosis: Other abnormalities of gait and mobility (R26.89)    Time: 1287-8676 PT Time Calculation (min) (ACUTE ONLY): 19 min   Charges:   PT Evaluation $PT Eval Low Complexity: 1 Low          Sheran Lawless, PT Acute Rehabilitation Services [phone] Office:[phone] 12/22/2021   Elray Mcgregor 12/22/2021, 10:40 AM

## 2021-12-22 NOTE — Progress Notes (Signed)
Carotid artery duplex and bilateral lower extremity venous duplex completed. Refer to "CV Proc" under chart review to view preliminary results.  12/22/2021 9:52 AM Eula Fried., MHA, RVT, RDCS, RDMS

## 2021-12-22 NOTE — Anesthesia Preprocedure Evaluation (Addendum)
Anesthesia Evaluation  Patient identified by MRN, date of birth, ID band Patient awake    Reviewed: Allergy & Precautions, NPO status , Patient's Chart, lab work & pertinent test results  History of Anesthesia Complications Negative for: history of anesthetic complications  Airway Mallampati: I  TM Distance: >3 FB Neck ROM: Full    Dental  (+) Edentulous Upper, Poor Dentition, Dental Advisory Given   Pulmonary neg pulmonary ROS, former smoker,    Pulmonary exam normal        Cardiovascular negative cardio ROS Normal cardiovascular exam  IMPRESSIONS    1. Left ventricular ejection fraction, by estimation, is 60 to 65%. The  left ventricle has normal function. The left ventricle has no regional  wall motion abnormalities. Left ventricular diastolic parameters were  normal.  2. Right ventricular systolic function is normal. The right ventricular  size is normal. Tricuspid regurgitation signal is inadequate for assessing  PA pressure.  3. The mitral valve is normal in structure. No evidence of mitral valve  regurgitation. No evidence of mitral stenosis.  4. The aortic valve was not well visualized. Aortic valve regurgitation  is not visualized. No aortic stenosis is present.    Neuro/Psych CVA    GI/Hepatic negative GI ROS, Neg liver ROS,   Endo/Other  negative endocrine ROS  Renal/GU negative Renal ROS     Musculoskeletal negative musculoskeletal ROS (+)   Abdominal   Peds  Hematology negative hematology ROS (+) anemia ,   Anesthesia Other Findings   Reproductive/Obstetrics                            Anesthesia Physical Anesthesia Plan  ASA: 3  Anesthesia Plan: General   Post-op Pain Management: Minimal or no pain anticipated   Induction:   PONV Risk Score and Plan: 3 and Treatment may vary due to age or medical condition and Ondansetron  Airway Management Planned: Oral  ETT  Additional Equipment: Arterial line  Intra-op Plan:   Post-operative Plan: Possible Post-op intubation/ventilation  Informed Consent: I have reviewed the patients History and Physical, chart, labs and discussed the procedure including the risks, benefits and alternatives for the proposed anesthesia with the patient or authorized representative who has indicated his/her understanding and acceptance.     Dental advisory given  Plan Discussed with: Anesthesiologist, CRNA and Surgeon  Anesthesia Plan Comments:        Anesthesia Quick Evaluation

## 2021-12-22 NOTE — Progress Notes (Signed)
STROKE TEAM PROGRESS NOTE   SUBJECTIVE (INTERVAL HISTORY) No family is at the bedside.  Overall her condition is rapidly improving.  She still has left hand mild weakness but improved from prior.  CT head and neck showed bilateral ICA stenosis more on the right.  Discussed with Dr. Corliss Skains, will plan for right ICA stenting tomorrow.   OBJECTIVE Temp:  [97.9 F (36.6 C)-99.4 F (37.4 C)] 99.4 F (37.4 C) (01/25 1549) Pulse Rate:  [66-77] 68 (01/25 1549) Cardiac Rhythm: Normal sinus rhythm (01/25 0717) Resp:  [18-20] 18 (01/25 1549) BP: (117-149)/(50-76) 145/70 (01/25 1549) SpO2:  [92 %-97 %] 97 % (01/25 1549)  Recent Labs  Lab 12/21/21 0735  GLUCAP 133*   Recent Labs  Lab 12/21/21 0050 12/22/21 0345  NA 137 137  K 3.8 3.5  CL 107 108  CO2 21* 20*  GLUCOSE 175* 111*  BUN 14 8  CREATININE 1.11* 0.91  CALCIUM 8.0* 8.0*   Recent Labs  Lab 12/21/21 0050 12/22/21 0345  AST 27 21  ALT 18 17  ALKPHOS 77 57  BILITOT 0.5 0.5  PROT 6.2* 5.1*  ALBUMIN 3.4* 2.8*   Recent Labs  Lab 12/21/21 0050 12/22/21 0526  WBC 13.9* 5.0  NEUTROABS 12.6*  --   HGB 11.6* 10.7*  HCT 36.8 32.8*  MCV 87.2 85.6  PLT 210 173   No results for input(s): CKTOTAL, CKMB, CKMBINDEX, TROPONINI in the last 168 hours. Recent Labs    12/21/21 0050 12/22/21 0345  LABPROT 13.9 13.9  INR 1.1 1.1   Recent Labs    12/21/21 0200  COLORURINE YELLOW  LABSPEC >1.030*  PHURINE 6.0  GLUCOSEU 250*  HGBUR NEGATIVE  BILIRUBINUR NEGATIVE  KETONESUR NEGATIVE  PROTEINUR TRACE*  NITRITE NEGATIVE  LEUKOCYTESUR NEGATIVE       Component Value Date/Time   CHOL 105 12/22/2021 0345   TRIG 63 12/22/2021 0345   HDL 46 12/22/2021 0345   CHOLHDL 2.3 12/22/2021 0345   VLDL 13 12/22/2021 0345   LDLCALC 46 12/22/2021 0345   No results found for: HGBA1C    Component Value Date/Time   LABOPIA NONE DETECTED 12/21/2021 0200   COCAINSCRNUR NONE DETECTED 12/21/2021 0200   LABBENZ POSITIVE (A) 12/21/2021  0200   AMPHETMU POSITIVE (A) 12/21/2021 0200   THCU NONE DETECTED 12/21/2021 0200   LABBARB NONE DETECTED 12/21/2021 0200    Recent Labs  Lab 12/21/21 0050  ETH <10    I have personally reviewed the radiological images below and agree with the radiology interpretations.  CT Angio Head W or Wo Contrast  Result Date: 12/21/2021 CLINICAL DATA:  Stroke follow-up.  Near syncope with slurred speech EXAM: CT ANGIOGRAPHY HEAD AND NECK TECHNIQUE: Multidetector CT imaging of the head and neck was performed using the standard protocol during bolus administration of intravenous contrast. Multiplanar CT image reconstructions and MIPs were obtained to evaluate the vascular anatomy. Carotid stenosis measurements (when applicable) are obtained utilizing NASCET criteria, using the distal internal carotid diameter as the denominator. RADIATION DOSE REDUCTION: This exam was performed according to the departmental dose-optimization program which includes automated exposure control, adjustment of the mA and/or kV according to patient size and/or use of iterative reconstruction technique. CONTRAST:  80mL OMNIPAQUE IOHEXOL 350 MG/ML SOLN COMPARISON:  Head CT from earlier today FINDINGS: CTA NECK FINDINGS Aortic arch: Atheromatous calcification with 3 vessel branching. Right carotid system: Atheromatous plaque, heavily calcified, at the bifurcation/proximal ICA but the lumen cannot be measured/assessed given the degree of laryngeal motion.  Flow reducing stenosis is definitely possible. No downstream stenosis or beading. Left carotid system: Atheromatous plaque especially heavy calcification at the bifurcation and proximal ICA. Due to laryngeal artifact the lumen cannot be assessed/measured. Possible flow limiting stenosis. No evidence of dissection. Vertebral arteries: Proximal left subclavian atheromatous plaque. No flow limiting stenosis. Dominant left vertebral artery. Both vertebral arteries are smoothly contoured and  widely patent to the dura. Skeleton: Cervical spine degeneration especially affecting left-sided facets. Other neck: No acute finding.  Motion artifact Upper chest: Clear apical lungs. Review of the MIP images confirms the above findings CTA HEAD FINDINGS Anterior circulation: Atheromatous calcification of the carotid siphons. No branch occlusion, beading, or aneurysm. Posterior circulation: Vertebral and basilar arteries are smoothly contoured and widely patent. Left vertebral artery is dominant right vertebral artery ending in PICA. No branch occlusion, beading, or aneurysm. Venous sinuses: Diffusely patent Anatomic variants: None significant Review of the MIP images confirms the above findings IMPRESSION: 1. No emergent finding. No evidence of vertebrobasilar insufficiency. 2. Most notable atheromatous plaque is at the bilateral carotid bifurcation. Nondiagnostic assessment of associated stenoses given patient motion, flow reducing stenosis could be present on either side and follow-up for carotid Doppler or repeat CTA recommended. Electronically Signed   By: Tiburcio Pea M.D.   On: 12/21/2021 06:11   CT ANGIO HEAD NECK W WO CM  Result Date: 12/22/2021 CLINICAL DATA:  Stroke workup EXAM: CT ANGIOGRAPHY HEAD AND NECK TECHNIQUE: Multidetector CT imaging of the head and neck was performed using the standard protocol during bolus administration of intravenous contrast. Multiplanar CT image reconstructions and MIPs were obtained to evaluate the vascular anatomy. Carotid stenosis measurements (when applicable) are obtained utilizing NASCET criteria, using the distal internal carotid diameter as the denominator. RADIATION DOSE REDUCTION: This exam was performed according to the departmental dose-optimization program which includes automated exposure control, adjustment of the mA and/or kV according to patient size and/or use of iterative reconstruction technique. CONTRAST:  3mL OMNIPAQUE IOHEXOL 350 MG/ML SOLN  COMPARISON:  Brain MRI from yesterday FINDINGS: CT HEAD FINDINGS Brain: Known small acute infarcts along the cortex at the high right cerebrum. No hemorrhage, hydrocephalus, or gross progression. Vascular: See below Skull: No acute finding Sinuses: Small presumed retention cyst in the left maxillary sinus. Orbits: Unremarkable Review of the MIP images confirms the above findings CTA NECK FINDINGS Aortic arch: Atheromatous calcifications. Three vessel branching. No acute finding. Right carotid system: Motion artifact again complicates evaluation of the carotid bifurcations. Still, there is better visualization of the bifurcation where there is mixed density plaque with proximal ICA luminal narrowing to less than 1 mm, 80-90% stenosis based on sagittal reformats. Bifurcation is below the right mandibular angle. On axial slices there is a seemingly luminal low-density projection suggesting thrombus, although less apparent on sagittal images. Left carotid system: Motion artifact limits evaluation of the bifurcation. There is mixed density, heavily calcified plaque the bifurcation with proximal ICA stenosis estimated at over 75%. No ulceration or dissection seen. Vertebral arteries: Proximal subclavian atherosclerosis without flow limiting stenosis. The left vertebral artery is dominant. Skeleton: No emergent finding. Asymmetric left degenerative facet spurring Other neck: No acute finding Upper chest: No acute finding Review of the MIP images confirms the above findings CTA HEAD FINDINGS Anterior circulation: Atheromatous plaque on the carotid siphons. No major branch occlusion, flow limiting stenosis, aneurysm, or vascular malformation. Posterior circulation: Left dominant vertebral artery. Right vertebral artery ends in PICA. No branch occlusion, beading, or aneurysm. Venous sinuses: Patent Anatomic  variants: None significant Review of the MIP images confirms the above findings IMPRESSION: Exam is again complicated by  patient motion degrading visualization of the carotid bifurcations, but improved from before and showing definite flow reducing stenosis at both proximal ICA. Worse disease on the right where proximal ICA narrowing measures 80-90% and there may be some luminal clot. 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 No gross progression and no interval hemorrhage. Electronically Signed   By: Tiburcio PeaJonathan  Watts M.D.   On: 12/22/2021 05:25   CT HEAD WO CONTRAST  Result Date: 12/21/2021 CLINICAL DATA:  Transient ischemic attack. EXAM: CT HEAD WITHOUT CONTRAST TECHNIQUE: Contiguous axial images were obtained from the base of the skull through the vertex without intravenous contrast. RADIATION DOSE REDUCTION: This exam was performed according to the departmental dose-optimization program which includes automated exposure control, adjustment of the mA and/or kV according to patient size and/or use of iterative reconstruction technique. COMPARISON:  Head CT dated 01/02/2009. FINDINGS: Brain: Mild age-related atrophy and chronic microvascular ischemic changes. There is no acute intracranial hemorrhage. No mass effect or midline shift. No extra-axial fluid collection. Vascular: No hyperdense vessel or unexpected calcification. Skull: Normal. Negative for fracture or focal lesion. Sinuses/Orbits: There is mucoperiosteal thickening of paranasal sinuses. No air-fluid level. The mastoid air cells are clear. Other: None IMPRESSION: 1. No acute intracranial pathology. 2. Mild age-related atrophy and chronic microvascular ischemic changes. Electronically Signed   By: Elgie CollardArash  Radparvar M.D.   On: 12/21/2021 01:35   CT Angio Neck W and/or Wo Contrast  Result Date: 12/21/2021 CLINICAL DATA:  Stroke follow-up.  Near syncope with slurred speech EXAM: CT ANGIOGRAPHY HEAD AND NECK TECHNIQUE: Multidetector CT imaging of the head and neck was performed using the standard protocol during bolus administration of intravenous  contrast. Multiplanar CT image reconstructions and MIPs were obtained to evaluate the vascular anatomy. Carotid stenosis measurements (when applicable) are obtained utilizing NASCET criteria, using the distal internal carotid diameter as the denominator. RADIATION DOSE REDUCTION: This exam was performed according to the departmental dose-optimization program which includes automated exposure control, adjustment of the mA and/or kV according to patient size and/or use of iterative reconstruction technique. CONTRAST:  80mL OMNIPAQUE IOHEXOL 350 MG/ML SOLN COMPARISON:  Head CT from earlier today FINDINGS: CTA NECK FINDINGS Aortic arch: Atheromatous calcification with 3 vessel branching. Right carotid system: Atheromatous plaque, heavily calcified, at the bifurcation/proximal ICA but the lumen cannot be measured/assessed given the degree of laryngeal motion. Flow reducing stenosis is definitely possible. No downstream stenosis or beading. Left carotid system: Atheromatous plaque especially heavy calcification at the bifurcation and proximal ICA. Due to laryngeal artifact the lumen cannot be assessed/measured. Possible flow limiting stenosis. No evidence of dissection. Vertebral arteries: Proximal left subclavian atheromatous plaque. No flow limiting stenosis. Dominant left vertebral artery. Both vertebral arteries are smoothly contoured and widely patent to the dura. Skeleton: Cervical spine degeneration especially affecting left-sided facets. Other neck: No acute finding.  Motion artifact Upper chest: Clear apical lungs. Review of the MIP images confirms the above findings CTA HEAD FINDINGS Anterior circulation: Atheromatous calcification of the carotid siphons. No branch occlusion, beading, or aneurysm. Posterior circulation: Vertebral and basilar arteries are smoothly contoured and widely patent. Left vertebral artery is dominant right vertebral artery ending in PICA. No branch occlusion, beading, or aneurysm. Venous  sinuses: Diffusely patent Anatomic variants: None significant Review of the MIP images confirms the above findings IMPRESSION: 1. No emergent finding. No evidence of vertebrobasilar insufficiency. 2. Most  notable atheromatous plaque is at the bilateral carotid bifurcation. Nondiagnostic assessment of associated stenoses given patient motion, flow reducing stenosis could be present on either side and follow-up for carotid Doppler or repeat CTA recommended. Electronically Signed   By: Tiburcio Pea M.D.   On: 12/21/2021 06:11   MR BRAIN WO CONTRAST  Result Date: 12/21/2021 CLINICAL DATA:  Neuro deficit, acute, stroke suspected. Near syncopal episode. Tremors. Slurred speech. Facial droop. EXAM: MRI HEAD WITHOUT CONTRAST TECHNIQUE: Multiplanar, multiecho pulse sequences of the brain and surrounding structures were obtained without intravenous contrast. COMPARISON:  CT studies earlier same day.  MRI 01/03/2009. FINDINGS: Brain: No abnormality affects the brainstem or cerebellum. Left cerebral hemisphere shows minimal chronic small-vessel change of the white matter. Right hemisphere shows numerous acute infarctions affecting the cortical brain in the right middle cerebral artery territory consistent with micro embolic infarctions. No large or confluent infarction. No mass effect or hemorrhage. Mild chronic small-vessel change affects the deep white matter of the right hemisphere as well as the left. No hydrocephalus or extra-axial collection. No mass lesion. Vascular: Major vessels at the base of the brain show flow. 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 No large confluent infarction. No sign of hemorrhage or mass effect. Otherwise, the brain has a good appearance for age, with only mild chronic small-vessel ischemic changes of the cerebral hemispheric white matter.  Electronically Signed   By: Paulina Fusi M.D.   On: 12/21/2021 10:09   ECHOCARDIOGRAM COMPLETE  Result Date: 12/21/2021    ECHOCARDIOGRAM REPORT   Patient Name:   Veronica Small Date of Exam: 12/21/2021 Medical Rec #:  [id number]        Height:       66.0 in Accession #:    [id number]       Weight:       177.0 lb Date of Birth:  1951-07-03        BSA:          1.899 m Patient Age:    70 years         BP:           156/84 mmHg Patient Gender: F                HR:           75 bpm. Exam Location:  Inpatient Procedure: 2D Echo, Cardiac Doppler and Color Doppler Indications:    Stroke  History:        Patient has no prior history of Echocardiogram examinations.                 Risk Factors:Former Smoker.  Sonographer:    Vanetta Shawl Referring Phys: [id number] Delila Pereyra A KYLE IMPRESSIONS  1. Left ventricular ejection fraction, by estimation, is 60 to 65%. The left ventricle has normal function. The left ventricle has no regional wall motion abnormalities. Left ventricular diastolic parameters were normal.  2. Right ventricular systolic function is normal. The right ventricular size is normal. Tricuspid regurgitation signal is inadequate for assessing PA pressure.  3. The mitral valve is normal in structure. No evidence of mitral valve regurgitation. No evidence of mitral stenosis.  4. The aortic valve was not well visualized. Aortic valve regurgitation is not visualized. No aortic stenosis is present. Comparison(s): No prior Echocardiogram. Conclusion(s)/Recommendation(s): No intracardiac source of embolism detected on this transthoracic study. Consider a transesophageal  echocardiogram to exclude cardiac source of embolism if clinically indicated. FINDINGS  Left Ventricle: Left ventricular ejection fraction, by estimation, is 60 to 65%. The left ventricle has normal function. The left ventricle has no regional wall motion abnormalities. The left ventricular internal cavity size was normal in size. There is  no left  ventricular hypertrophy. Left ventricular diastolic parameters were normal. Right Ventricle: Calcified papillary muscle. The right ventricular size is normal. No increase in right ventricular wall thickness. Right ventricular systolic function is normal. Tricuspid regurgitation signal is inadequate for assessing PA pressure. Left Atrium: Left atrial size was normal in size. Right Atrium: Right atrial size was normal in size. Pericardium: There is no evidence of pericardial effusion. Mitral Valve: The mitral valve is normal in structure. No evidence of mitral valve regurgitation. No evidence of mitral valve stenosis. Tricuspid Valve: The tricuspid valve is normal in structure. Tricuspid valve regurgitation is not demonstrated. No evidence of tricuspid stenosis. Aortic Valve: The aortic valve was not well visualized. Aortic valve regurgitation is not visualized. No aortic stenosis is present. Aortic valve mean gradient measures 4.5 mmHg. Aortic valve peak gradient measures 8.4 mmHg. Aortic valve area, by VTI measures 2.01 cm. Pulmonic Valve: The pulmonic valve was normal in structure. Pulmonic valve regurgitation is not visualized. No evidence of pulmonic stenosis. Aorta: The aortic root and ascending aorta are structurally normal, with no evidence of dilitation. Venous: The inferior vena cava was not well visualized. IAS/Shunts: No atrial level shunt detected by color flow Doppler.  LEFT VENTRICLE PLAX 2D LVIDd:         4.70 cm   Diastology LVIDs:         3.10 cm   LV e' medial:    9.46 cm/s LV PW:         1.00 cm   LV E/e' medial:  11.7 LV IVS:        1.00 cm   LV e' lateral:   10.60 cm/s LVOT diam:     1.80 cm   LV E/e' lateral: 10.5 LV SV:         66 LV SV Index:   35 LVOT Area:     2.54 cm  RIGHT VENTRICLE RV S prime:     12.30 cm/s LEFT ATRIUM           Index LA diam:      4.30 cm 2.26 cm/m LA Vol (A2C): 37.4 ml 19.70 ml/m  AORTIC VALVE                     PULMONIC VALVE AV Area (Vmax):    2.02 cm      PV  Vmax:       1.13 m/s AV Area (Vmean):   2.08 cm      PV Peak grad:  5.1 mmHg AV Area (VTI):     2.01 cm AV Vmax:           145.00 cm/s AV Vmean:          100.400 cm/s AV VTI:            0.328 m AV Peak Grad:      8.4 mmHg AV Mean Grad:      4.5 mmHg LVOT Vmax:         115.00 cm/s LVOT Vmean:        81.900 cm/s LVOT VTI:          0.259 m LVOT/AV VTI ratio: 0.79  AORTA  Ao Root diam: 2.90 cm Ao Asc diam:  3.00 cm MITRAL VALVE MV Area (PHT): 5.09 cm     SHUNTS MV Decel Time: 149 msec     Systemic VTI:  0.26 m MV E velocity: 111.00 cm/s  Systemic Diam: 1.80 cm MV A velocity: 123.00 cm/s MV E/A ratio:  0.90 Riley Lam MD Electronically signed by Riley Lam MD Signature Date/Time: 12/21/2021/3:03:59 PM    Final    VAS US CAROTID (at [facility] and [facility] only)  Result Date: 12/22/2021 Carotid Arterial Duplex Study Patient Name:  Veronica Small  Date of Exam:   12/22/2021 Medical Rec #: [id number]         Accession #:    [id number] Date of Birth: Jan 23, 1951         Patient Gender: F Patient Age:   69 years Exam Location:  [facility] Procedure:      VAS US CAROTID Referring Phys: Margie Ege --------------------------------------------------------------------------------  Indications:       CVA and Carotid artery disease. Risk Factors:      Hyperlipidemia. Comparison Study:  No prior study Performing Technologist: Gertie Fey MHA, RDMS, RVT, RDCS  Examination Guidelines: A complete evaluation includes B-mode imaging, spectral Doppler, color Doppler, and power Doppler as needed of all accessible portions of each vessel. Bilateral testing is considered an integral part of a complete examination. Limited examinations for reoccurring indications may be performed as noted.  Right Carotid Findings: +----------+--------+--------+--------+-------------------------+---------+             PSV cm/s EDV cm/s Stenosis Plaque Description        Comments    +----------+--------+--------+--------+-------------------------+---------+  CCA Prox   70       12                                                     +----------+--------+--------+--------+-------------------------+---------+  CCA Distal 66       15                calcific and heterogenous Shadowing  +----------+--------+--------+--------+-------------------------+---------+  ICA Prox   220      71       60-79%   calcific and heterogenous Shadowing  +----------+--------+--------+--------+-------------------------+---------+  ICA Mid    105      26                                                     +----------+--------+--------+--------+-------------------------+---------+  ICA Distal 109      33                                                     +----------+--------+--------+--------+-------------------------+---------+  ECA        453      88       >50%                                          +----------+--------+--------+--------+-------------------------+---------+ +----------+--------+-------+----------------+-------------------+  PSV cm/s EDV cms Describe         Arm Pressure (mmHG)  +----------+--------+-------+----------------+-------------------+  Subclavian 125              Multiphasic, WNL                      +----------+--------+-------+----------------+-------------------+ +---------+--------+--+--------+--+---------+  Vertebral PSV cm/s 59 EDV cm/s 14 Antegrade  +---------+--------+--+--------+--+---------+  Left Carotid Findings: +----------+--------+--------+--------+------------------------------+---------+             PSV cm/s EDV cm/s Stenosis Plaque Description             Comments   +----------+--------+--------+--------+------------------------------+---------+  CCA Prox   93       24                                                          +----------+--------+--------+--------+------------------------------+---------+  CCA Distal 82       21                irregular and  calcific         Shadowing  +----------+--------+--------+--------+------------------------------+---------+  ICA Prox   289      74       60-79%   heterogenous, irregular and    Shadowing                                         calcific                                  +----------+--------+--------+--------+------------------------------+---------+  ICA Mid    205      51                                                          +----------+--------+--------+--------+------------------------------+---------+  ICA Distal 90       18                                                          +----------+--------+--------+--------+------------------------------+---------+  ECA        227      23                                                          +----------+--------+--------+--------+------------------------------+---------+ +----------+--------+--------+----------------+-------------------+             PSV cm/s EDV cm/s Describe         Arm Pressure (mmHG)  +----------+--------+--------+----------------+-------------------+  Subclavian 107               Multiphasic, WNL                      +----------+--------+--------+----------------+-------------------+ +---------+--------+--+--------+--+---------+  Vertebral PSV cm/s 93 EDV cm/s 23 Antegrade  +---------+--------+--+--------+--+---------+   Summary: Right Carotid: Velocities in the right ICA are consistent with a 60-79%                stenosis. The ECA appears >50% stenosed. Left Carotid: Velocities in the left ICA are consistent with a 60-79% stenosis. Vertebrals:  Bilateral vertebral arteries demonstrate antegrade flow. Subclavians: Normal flow hemodynamics were seen in bilateral subclavian              arteries. *See table(s) above for measurements and observations.     Preliminary    VAS [place] LOWER EXTREMITY VENOUS (DVT)  Result Date: 12/22/2021  Lower Venous DVT Study Patient Name:  Veronica Small  Date of Exam:   12/22/2021 Medical Rec #: [id number]          Accession #:    [id number] Date of Birth: 01/18/1951         Patient Gender: F Patient Age:   22 years Exam Location:  [facility] Procedure:      VAS [place] LOWER EXTREMITY VENOUS (DVT) Referring Phys: Scheryl Marten Sebastin Perlmutter --------------------------------------------------------------------------------  Indications: Stroke.  Comparison Study: No prior study Performing Technologist: Gertie Fey MHA, RDMS, RVT, RDCS  Examination Guidelines: A complete evaluation includes B-mode imaging, spectral Doppler, color Doppler, and power Doppler as needed of all accessible portions of each vessel. Bilateral testing is considered an integral part of a complete examination. Limited examinations for reoccurring indications may be performed as noted. The reflux portion of the exam is performed with the patient in reverse Trendelenburg.  +---------+---------------+---------+-----------+----------+--------------+  RIGHT     Compressibility Phasicity Spontaneity Properties Thrombus Aging  +---------+---------------+---------+-----------+----------+--------------+  CFV       Full            Yes       Yes                                    +---------+---------------+---------+-----------+----------+--------------+  SFJ       Full                                                             +---------+---------------+---------+-----------+----------+--------------+  FV Prox   Full                                                             +---------+---------------+---------+-----------+----------+--------------+  FV Mid    Full                                                             +---------+---------------+---------+-----------+----------+--------------+  FV Distal Full                                                             +---------+---------------+---------+-----------+----------+--------------+  PFV       Full                                                              +---------+---------------+---------+-----------+----------+--------------+  POP       Full            Yes       Yes                                    +---------+---------------+---------+-----------+----------+--------------+  PTV       Full                                                             +---------+---------------+---------+-----------+----------+--------------+  PERO      Full                                                             +---------+---------------+---------+-----------+----------+--------------+   +---------+---------------+---------+-----------+----------+--------------+  LEFT      Compressibility Phasicity Spontaneity Properties Thrombus Aging  +---------+---------------+---------+-----------+----------+--------------+  CFV       Full            Yes       Yes                                    +---------+---------------+---------+-----------+----------+--------------+  SFJ       Full                                                             +---------+---------------+---------+-----------+----------+--------------+  FV Prox   Full                                                             +---------+---------------+---------+-----------+----------+--------------+  FV Mid    Full                                                             +---------+---------------+---------+-----------+----------+--------------+  FV Distal Full                                                             +---------+---------------+---------+-----------+----------+--------------+  PFV       Full                                                             +---------+---------------+---------+-----------+----------+--------------+  POP       Full            Yes       Yes                                    +---------+---------------+---------+-----------+----------+--------------+  PTV       Full                                                              +---------+---------------+---------+-----------+----------+--------------+  PERO      Full                                                             +---------+---------------+---------+-----------+----------+--------------+    Summary: RIGHT: - There is no evidence of deep vein thrombosis in the lower extremity.  - No cystic structure found in the popliteal fossa.  LEFT: - There is no evidence of deep vein thrombosis in the lower extremity.  - No cystic structure found in the popliteal fossa.  *See table(s) above for measurements and observations.    Preliminary      PHYSICAL EXAM  Temp:  [97.9 F (36.6 C)-99.4 F (37.4 C)] 99.4 F (37.4 C) (01/25 1549) Pulse Rate:  [66-77] 68 (01/25 1549) Resp:  [18-20] 18 (01/25 1549) BP: (117-149)/(50-76) 145/70 (01/25 1549) SpO2:  [92 %-97 %] 97 % (01/25 1549)  General - Well nourished, well developed, in no apparent distress.  Ophthalmologic - fundi not visualized due to noncooperation.  Cardiovascular - Regular rhythm and rate.  Mental Status -  Level of arousal and orientation to time, place, and person were intact. Language including expression, naming, repetition, comprehension was assessed and found intact. Fund of Knowledge was assessed and was intact.  Cranial Nerves II - XII - II - Visual field intact OU. III, IV, VI - Extraocular movements intact. V - Facial sensation intact bilaterally. VII - Facial movement intact bilaterally. VIII - Hearing & vestibular intact bilaterally. X - Palate elevates symmetrically. XI - Chin turning & shoulder shrug intact bilaterally. XII - Tongue protrusion intact.  Motor Strength - The patients strength was normal in all extremities and pronator drift was absent except mild left hand gripping weakness and dexterity difficulty.  Bulk was normal and fasciculations were absent.   Motor Tone - Muscle tone was assessed at the neck and appendages and was normal.  Reflexes - The patients reflexes were  symmetrical in all extremities and she had no pathological reflexes.  Sensory - Light touch, temperature/pinprick were assessed and were symmetrical.    Coordination - The patient had normal movements in  the hands and feet with no ataxia or dysmetria however, slow on the left finger-to-nose.  Tremor was absent.  Gait and Station - deferred.   ASSESSMENT/PLAN Veronica Small is a 71 y.o. female with history of hypertension, hyperlipidemia, anxiety admitted for left upper extremity weakness and numbness. No tPA given due to OOW.    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   CTA head and neck x2 showed bilateral proximal ICA flow reducing stenosis.  Right ICA 80 to 90% stenosis and left ICA estimated at over 75%. MRI right MCA scattered infarcts Carotid Doppler bilateral ICA 60 to 79% stenosis LE venous Doppler no DVT Cerebral angiogram pending 2D Echo EF 60 to 60% LDL 46 HgbA1c pending UDS positive for benzo and amphentermine SCDs for VTE prophylaxis No antithrombotic prior to admission, now on aspirin 81 mg daily and Brilinta (ticagrelor) 90 mg bid.  Patient counseled to be compliant with her antithrombotic medications Ongoing aggressive stroke risk factor management Therapy recommendations: Pending Disposition: Pending  Carotid stenosis CTA head and neck x2 showed bilateral proximal ICA flow reducing stenosis.  Right ICA 80 to 90% stenosis and left ICA estimated at over 75%. Carotid Doppler bilateral ICA 60 to 79% stenosis Symptomatic right ICA stenosis at this time Discussed with Dr. Corliss Skains, plan for right ICA stenting tomorrow.  Hypertension Stable Avoid low BP BP goal 140-160 before carotid revascularization  Hyperlipidemia Home meds: Pravastatin 40 LDL 46, goal < 70 Now on pravastatin 40 Continue statin at discharge  Other Stroke Risk Factors   Other Active  Problems Anxiety Depression Leukocytosis WBC 13.9  Hospital day # 1    Marvel Plan, MD PhD Stroke Neurology 12/22/2021 6:14 PM    To contact Stroke Continuity provider, please refer to [email or web address]. After hours, contact General Neurology

## 2021-12-22 NOTE — TOC Initial Note (Signed)
Transition of Care [facility]) - Initial/Assessment Note    Patient Details  Name: Veronica Small MRN: [id number] Date of Birth: Mar 24, 1951  Transition [facility]) CM/SW Contact:    Kermit Balo, RN Phone Number: 12/22/2021, 3:42 PM  Clinical Narrative:                 Patient is from home with a roommate. She denies any issues with home medications or transportation.  Recommendations for outpatient OT. Pt prefers to attend at [facility]. Orders in Epic and information on the AVS.  Pt states son will provide transport home when medically ready. TOC following.   Expected Discharge Plan: OP Rehab Barriers to Discharge: Continued Medical Work up   Patient Goals and CMS Choice     Choice offered to / list presented to : Patient  Expected Discharge Plan and Services Expected Discharge Plan: OP Rehab   Discharge Planning Services: CM Consult   Living arrangements for the past 2 months: Mobile Home                                      Prior Living Arrangements/Services Living arrangements for the past 2 months: Mobile Home Lives with:: Roommate Patient language and need for interpreter reviewed:: Yes Do you feel safe going back to the place where you live?: Yes            Criminal Activity/Legal Involvement Pertinent to Current Situation/Hospitalization: No - Comment as needed  Activities of Daily Living Home Assistive Devices/Equipment: Dentures (specify type) ADL Screening (condition at time of admission) Patient's cognitive ability adequate to safely complete daily activities?: Yes Is the patient deaf or have difficulty hearing?: No Does the patient have difficulty seeing, even when wearing glasses/contacts?: No Does the patient have difficulty concentrating, remembering, or making decisions?: No Patient able to express need for assistance with ADLs?: Yes Does the patient have difficulty dressing or bathing?: No Independently performs ADLs?: Yes (appropriate  for developmental age) Does the patient have difficulty walking or climbing stairs?: No Weakness of Legs: None Weakness of Arms/Hands: None  Permission Sought/Granted                  Emotional Assessment Appearance:: Appears stated age Attitude/Demeanor/Rapport: Engaged Affect (typically observed): Accepting Orientation: : Oriented to Self, Oriented to  Time, Oriented to Place, Oriented to Situation   Psych Involvement: No (comment)  Admission diagnosis:  TIA (transient ischemic attack) [G45.9] Cellulitis of right upper extremity [L03.113] Sepsis [facility]) [A41.9] Patient Active Problem List   Diagnosis Date Noted   Sepsis (HCC) 12/21/2021   Cerebral embolism with cerebral infarction 12/21/2021   PCP:  Renford Dills, MD Pharmacy:   [facility] [address], [place] - [address] [place] [facility] [place] [address] Phone: [phone] Fax: [phone]     Social Determinants of Health (SDOH) Interventions    Readmission Risk Interventions No flowsheet data found.

## 2021-12-22 NOTE — Progress Notes (Signed)
°   12/22/21 1645  Clinical Encounter Type  Visited With Patient  Visit Type Spiritual support  Referral From Nurse  Consult/Referral To None   Chaplain responded to to Spiritual Consult for prayer. Spent time listening to the patient  As we prayed.   Valerie Roys Chaplain Resident  [facility]  [phone]

## 2021-12-23 ENCOUNTER — Inpatient Hospital Stay (HOSPITAL_COMMUNITY): Payer: No Typology Code available for payment source | Admitting: Anesthesiology

## 2021-12-23 ENCOUNTER — Inpatient Hospital Stay (HOSPITAL_COMMUNITY): Payer: No Typology Code available for payment source

## 2021-12-23 ENCOUNTER — Encounter (HOSPITAL_COMMUNITY): Payer: Self-pay | Admitting: Internal Medicine

## 2021-12-23 ENCOUNTER — Other Ambulatory Visit (HOSPITAL_COMMUNITY): Payer: Self-pay

## 2021-12-23 ENCOUNTER — Encounter (HOSPITAL_COMMUNITY)
Admission: EM | Disposition: A | Discharge status: Home / Self Care | Payer: Self-pay | Source: Home / Self Care | Attending: Internal Medicine

## 2021-12-23 DIAGNOSIS — I6521 Occlusion and stenosis of right carotid artery: Secondary | ICD-10-CM

## 2021-12-23 HISTORY — PX: IR INTRAVSC STENT CERV CAROTID W/EMB-PROT MOD SED INCL ANGIO: IMG2303

## 2021-12-23 HISTORY — PX: RADIOLOGY WITH ANESTHESIA: SHX6223

## 2021-12-23 HISTORY — PX: IR ANGIO VERTEBRAL SEL SUBCLAVIAN INNOMINATE UNI L MOD SED: IMG5364

## 2021-12-23 HISTORY — PX: IR ANGIO INTRA EXTRACRAN SEL COM CAROTID INNOMINATE UNI L MOD SED: IMG5358

## 2021-12-23 LAB — CBC WITH DIFFERENTIAL/PLATELET
Abs Immature Granulocytes: 0.03 10*3/uL (ref 0.00–0.07)
Basophils Absolute: 0 10*3/uL (ref 0.0–0.1)
Basophils Relative: 0 %
Eosinophils Absolute: 0.1 10*3/uL (ref 0.0–0.5)
Eosinophils Relative: 1 %
HCT: 33.8 % — ABNORMAL LOW (ref 36.0–46.0)
Hemoglobin: 11.1 g/dL — ABNORMAL LOW (ref 12.0–15.0)
Immature Granulocytes: 1 %
Lymphocytes Relative: 20 %
Lymphs Abs: 1.3 10*3/uL (ref 0.7–4.0)
MCH: 27.6 pg (ref 26.0–34.0)
MCHC: 32.8 g/dL (ref 30.0–36.0)
MCV: 84.1 fL (ref 80.0–100.0)
Monocytes Absolute: 0.5 10*3/uL (ref 0.1–1.0)
Monocytes Relative: 7 %
Neutro Abs: 4.4 10*3/uL (ref 1.7–7.7)
Neutrophils Relative %: 71 %
Platelets: 203 10*3/uL (ref 150–400)
RBC: 4.02 MIL/uL (ref 3.87–5.11)
RDW: 12.3 % (ref 11.5–15.5)
WBC: 6.2 10*3/uL (ref 4.0–10.5)
nRBC: 0 % (ref 0.0–0.2)

## 2021-12-23 LAB — BASIC METABOLIC PANEL
Anion gap: 5 (ref 5–15)
BUN: 8 mg/dL (ref 8–23)
CO2: 22 mmol/L (ref 22–32)
Calcium: 8 mg/dL — ABNORMAL LOW (ref 8.9–10.3)
Chloride: 109 mmol/L (ref 98–111)
Creatinine, Ser: 0.94 mg/dL (ref 0.44–1.00)
GFR, Estimated: >60 mL/min (ref >60)
Glucose, Bld: 106 mg/dL — ABNORMAL HIGH (ref 70–99)
Potassium: 3.8 mmol/L (ref 3.5–5.1)
Sodium: 136 mmol/L (ref 135–145)

## 2021-12-23 LAB — SURGICAL PCR SCREEN
MRSA, PCR: NEGATIVE
Staphylococcus aureus: NEGATIVE

## 2021-12-23 LAB — POCT ACTIVATED CLOTTING TIME
Activated Clotting Time: 197 seconds
Activated Clotting Time: 221 seconds

## 2021-12-23 LAB — PROTIME-INR
INR: 1 (ref 0.8–1.2)
Prothrombin Time: 13.2 seconds (ref 11.4–15.2)

## 2021-12-23 LAB — MRSA NEXT GEN BY PCR, NASAL: MRSA by PCR Next Gen: NOT DETECTED

## 2021-12-23 LAB — HEPARIN LEVEL (UNFRACTIONATED): Heparin Unfractionated: <0.1 IU/mL — ABNORMAL LOW (ref 0.30–0.70)

## 2021-12-23 SURGERY — IR WITH ANESTHESIA
Anesthesia: General

## 2021-12-23 MED ORDER — ONDANSETRON HCL 4 MG/2ML IJ SOLN
INTRAMUSCULAR | Status: DC | PRN
  Administered 2021-12-23: 4 mg via INTRAVENOUS

## 2021-12-23 MED ORDER — FENTANYL CITRATE (PF) 100 MCG/2ML IJ SOLN: 25.0000 ug | INTRAMUSCULAR | Status: DC | PRN

## 2021-12-23 MED ORDER — PHENYLEPHRINE HCL-NACL 20-0.9 MG/250ML-% IV SOLN
INTRAVENOUS | Status: DC | PRN
  Administered 2021-12-23: 20 ug/min via INTRAVENOUS

## 2021-12-23 MED ORDER — HEPARIN SODIUM (PORCINE) 1000 UNIT/ML IJ SOLN
INTRAMUSCULAR | Status: DC | PRN
Start: 2021-12-23 — End: 2021-12-23
  Administered 2021-12-23: 3000 [IU] via INTRAVENOUS
  Administered 2021-12-23: 1000 [IU] via INTRAVENOUS

## 2021-12-23 MED ORDER — ACETAMINOPHEN 650 MG RE SUPP
650.0000 mg | RECTAL | Status: DC | PRN
Start: 2021-12-23 — End: 2021-12-25

## 2021-12-23 MED ORDER — PROPOFOL 10 MG/ML IV BOLUS
INTRAVENOUS | Status: DC | PRN
Start: 2021-12-23 — End: 2021-12-23
  Administered 2021-12-23: 150 mg via INTRAVENOUS

## 2021-12-23 MED ORDER — ACETAMINOPHEN 160 MG/5ML PO SOLN: 650.0000 mg | ORAL | Status: DC | PRN

## 2021-12-23 MED ORDER — AMISULPRIDE (ANTIEMETIC) 5 MG/2ML IV SOLN: 10.0000 mg | Freq: Once | INTRAVENOUS | Status: DC | PRN

## 2021-12-23 MED ORDER — TICAGRELOR 90 MG PO TABS
90.0000 mg | ORAL_TABLET | Freq: Two times a day (BID) | ORAL | Status: DC
  Filled 2021-12-23: qty 1

## 2021-12-23 MED ORDER — EPHEDRINE SULFATE-NACL 50-0.9 MG/10ML-% IV SOSY
PREFILLED_SYRINGE | INTRAVENOUS | Status: DC | PRN
  Administered 2021-12-23: 5 mg via INTRAVENOUS

## 2021-12-23 MED ORDER — SODIUM CHLORIDE 0.9 % IV BOLUS
500.0000 mL | Freq: Once | INTRAVENOUS | Status: AC
  Administered 2021-12-23: 500 mL via INTRAVENOUS

## 2021-12-23 MED ORDER — CHLORHEXIDINE GLUCONATE 0.12 % MT SOLN
15.0000 mL | Freq: Once | OROMUCOSAL | Status: AC
  Administered 2021-12-23: 15 mL via OROMUCOSAL
  Filled 2021-12-23: qty 15

## 2021-12-23 MED ORDER — SUGAMMADEX SODIUM 200 MG/2ML IV SOLN
INTRAVENOUS | Status: DC | PRN
Start: 2021-12-23 — End: 2021-12-23
  Administered 2021-12-23: 200 mg via INTRAVENOUS

## 2021-12-23 MED ORDER — HEPARIN (PORCINE) 25000 UT/250ML-% IV SOLN: 750.0000 [IU]/h | INTRAVENOUS | Status: DC

## 2021-12-23 MED ORDER — EPTIFIBATIDE 20 MG/10ML IV SOLN
INTRAVENOUS | Status: AC
  Filled 2021-12-23: qty 10

## 2021-12-23 MED ORDER — GLYCOPYRROLATE 0.2 MG/ML IJ SOLN
INTRAMUSCULAR | Status: DC | PRN
  Administered 2021-12-23 (×2): .1 mg via INTRAVENOUS

## 2021-12-23 MED ORDER — SODIUM CHLORIDE 0.9 % IV SOLN: INTRAVENOUS | Status: DC

## 2021-12-23 MED ORDER — ASPIRIN 81 MG PO CHEW
81.0000 mg | CHEWABLE_TABLET | Freq: Every day | ORAL | Status: DC
  Administered 2021-12-24 – 2021-12-25 (×2): 81 mg via ORAL
  Filled 2021-12-23 (×2): qty 1

## 2021-12-23 MED ORDER — SODIUM CHLORIDE 0.9 % IV SOLN: INTRAVENOUS | Status: DC | PRN

## 2021-12-23 MED ORDER — ORAL CARE MOUTH RINSE: 15.0000 mL | Freq: Once | OROMUCOSAL | Status: AC

## 2021-12-23 MED ORDER — HEPARIN (PORCINE) 25000 UT/250ML-% IV SOLN
500.0000 [IU]/h | INTRAVENOUS | Status: DC
  Administered 2021-12-23: 500 [IU]/h via INTRAVENOUS

## 2021-12-23 MED ORDER — PROMETHAZINE HCL 25 MG/ML IJ SOLN: 6.2500 mg | INTRAMUSCULAR | Status: DC | PRN

## 2021-12-23 MED ORDER — CLEVIDIPINE BUTYRATE 0.5 MG/ML IV EMUL
INTRAVENOUS | Status: AC
  Filled 2021-12-23: qty 50

## 2021-12-23 MED ORDER — HEPARIN (PORCINE) 25000 UT/250ML-% IV SOLN
600.0000 [IU]/h | INTRAVENOUS | Status: DC
  Filled 2021-12-23: qty 250

## 2021-12-23 MED ORDER — VANCOMYCIN HCL 1000 MG IV SOLR
INTRAVENOUS | Status: DC | PRN
  Administered 2021-12-23: 1000 mg via INTRAVENOUS

## 2021-12-23 MED ORDER — HEPARIN (PORCINE) 25000 UT/250ML-% IV SOLN
INTRAVENOUS | Status: AC
  Filled 2021-12-23: qty 250

## 2021-12-23 MED ORDER — ROCURONIUM BROMIDE 10 MG/ML (PF) SYRINGE
PREFILLED_SYRINGE | INTRAVENOUS | Status: DC | PRN
Start: 2021-12-23 — End: 2021-12-23
  Administered 2021-12-23: 100 mg via INTRAVENOUS

## 2021-12-23 MED ORDER — TICAGRELOR 90 MG PO TABS
90.0000 mg | ORAL_TABLET | Freq: Two times a day (BID) | ORAL | Status: DC
  Administered 2021-12-23 – 2021-12-25 (×4): 90 mg via ORAL
  Filled 2021-12-23 (×4): qty 1

## 2021-12-23 MED ORDER — FENTANYL CITRATE (PF) 250 MCG/5ML IJ SOLN
INTRAMUSCULAR | Status: DC | PRN
  Administered 2021-12-23 (×2): 50 ug via INTRAVENOUS

## 2021-12-23 MED ORDER — LIDOCAINE 2% (20 MG/ML) 5 ML SYRINGE
INTRAMUSCULAR | Status: DC | PRN
  Administered 2021-12-23: 100 mg via INTRAVENOUS

## 2021-12-23 MED ORDER — CHLORHEXIDINE GLUCONATE CLOTH 2 % EX PADS
6.0000 | MEDICATED_PAD | Freq: Every day | CUTANEOUS | Status: DC
  Administered 2021-12-24: 6 via TOPICAL

## 2021-12-23 MED ORDER — CLEVIDIPINE BUTYRATE 0.5 MG/ML IV EMUL
INTRAVENOUS | Status: DC | PRN
Start: 2021-12-23 — End: 2021-12-23
  Administered 2021-12-23: 1 mg/h via INTRAVENOUS

## 2021-12-23 MED ORDER — CLEVIDIPINE BUTYRATE 0.5 MG/ML IV EMUL
0.0000 mg/h | INTRAVENOUS | Status: AC
  Administered 2021-12-23: 6 mg/h via INTRAVENOUS
  Administered 2021-12-23: 9 mg/h via INTRAVENOUS
  Administered 2021-12-23: 4 mg/h via INTRAVENOUS
  Filled 2021-12-23 (×2): qty 50

## 2021-12-23 MED ORDER — ACETAMINOPHEN 325 MG PO TABS
650.0000 mg | ORAL_TABLET | ORAL | Status: DC | PRN
  Administered 2021-12-23 – 2021-12-24 (×2): 650 mg via ORAL
  Filled 2021-12-23 (×2): qty 2

## 2021-12-23 MED ORDER — IOHEXOL 300 MG/ML  SOLN: 100.0000 mL | Freq: Once | INTRAMUSCULAR | Status: DC | PRN

## 2021-12-23 MED ORDER — EPTIFIBATIDE 20 MG/10ML IV SOLN
INTRAVENOUS | Status: DC | PRN
  Administered 2021-12-23 (×2): 1.5 mg via INTRA_ARTERIAL

## 2021-12-23 MED ORDER — ASPIRIN 81 MG PO CHEW
81.0000 mg | CHEWABLE_TABLET | Freq: Every day | ORAL | Status: DC
  Filled 2021-12-23: qty 1

## 2021-12-23 NOTE — Anesthesia Postprocedure Evaluation (Signed)
Anesthesia Post Note  Patient: Veronica Small  Procedure(s) Performed: Cerebral angiogram with angioplasty and possible stenting     Patient location during evaluation: PACU Anesthesia Type: General Level of consciousness: sedated Pain management: pain level controlled Vital Signs Assessment: post-procedure vital signs reviewed and stable Respiratory status: spontaneous breathing and respiratory function stable Cardiovascular status: stable Postop Assessment: no apparent nausea or vomiting Anesthetic complications: no   No notable events documented.  Last Vitals:  Vitals:   12/23/21 1700 12/23/21 1715  BP: (!) 105/49   Pulse: 72 66  Resp:    Temp:    SpO2: 97% 96%    Last Pain:  Vitals:   12/23/21 1600  TempSrc: Oral  PainSc:                  Karna Abed DANIEL

## 2021-12-23 NOTE — Progress Notes (Signed)
ANTICOAGULATION CONSULT NOTE - Initial Consult  Pharmacy Consult:  Heparin Indication: Post interventional neuroradiology procedure  No Known Allergies  Patient Measurements: Height: 5\' 6"  (167.6 cm) Weight: 80.3 kg (177 lb) IBW/kg (Calculated) : 59.3 Heparin Dosing Weight: 76 kg  Vital Signs: Temp: 97.1 F (36.2 C) (01/26 1255) Temp Source: Oral (01/26 0920) BP: 107/49 (01/26 1355) Pulse Rate: 58 (01/26 1355)  Labs: Recent Labs    12/21/21 0050 12/22/21 0345 12/22/21 0526 12/23/21 0337  HGB 11.6*  --  10.7* 11.1*  HCT 36.8  --  32.8* 33.8*  PLT 210  --  173 203  APTT 22*  --   --   --   LABPROT 13.9 13.9  --  13.2  INR 1.1 1.1  --  1.0  CREATININE 1.11* 0.91  --  0.94    Estimated Creatinine Clearance: 59.5 mL/min (by C-G formula based on SCr of 0.94 mg/dL).   Medical History: History reviewed. No pertinent past medical history.   Assessment: 74 YOF with R MCA infarct likely secondary to R ICA high-grade stenosis.  Now s/p revascularization with stenting and pharmacy consulted to dose IV heparin.  Baseline labs reviewed.  Goal of Therapy:  Heparin level 0.1-0.25 units/ml Monitor platelets by anticoagulation protocol: Yes   Plan:  Increase heparin gtt to 600 units/hr Check 8 hr heparin level  Alechia Lezama D. 01-18-1975, PharmD, BCPS, BCCCP 12/23/2021, 2:06 PM

## 2021-12-23 NOTE — Progress Notes (Signed)
Pt seen in neuro ICU post procedure. Resting well. On hep gtt and cleviprex, BPs stable for the most part. BP (!) 107/47 (BP Location: Right Arm)    Pulse 68    Temp 98 F (36.7 C) (Oral)    Resp 19    Ht 5\' 6"  (1.676 m)    Wt 80 kg    SpO2 95%    BMI 28.47 kg/m  A&) x 3 PERRLA, EOMI Face symmetric, tongue midline No drift Fine motor and strength intact (R)groin dressing with some serosanguinous staining. Otherwise soft, NT, no hematoma Feet warm, pedal pulses palpable. Cont heparin overnight.    PA-C Interventional Radiology 12/23/2021 4:06 PM

## 2021-12-23 NOTE — Progress Notes (Signed)
ANTICOAGULATION CONSULT NOTE Pharmacy Consult:  Heparin Indication: Post interventional neuroradiology procedure Brief A/P: Heparin level subtherapeutic Increase Heparin rate  No Known Allergies  Patient Measurements: Height: 5\' 6"  (167.6 cm) Weight: 80 kg (176 lb 5.9 oz) IBW/kg (Calculated) : 59.3 Heparin Dosing Weight: 76 kg  Vital Signs: Temp: 98.4 F (36.9 C) (01/26 2000) Temp Source: Oral (01/26 2000) BP: 108/53 (01/26 2300) Pulse Rate: 68 (01/26 2300)  Labs: Recent Labs    12/21/21 0050 12/22/21 0345 12/22/21 0526 12/23/21 0337 12/23/21 2241  HGB 11.6*  --  10.7* 11.1*  --   HCT 36.8  --  32.8* 33.8*  --   PLT 210  --  173 203  --   APTT 22*  --   --   --   --   LABPROT 13.9 13.9  --  13.2  --   INR 1.1 1.1  --  1.0  --   HEPARINUNFRC  --   --   --   --  <0.10*  CREATININE 1.11* 0.91  --  0.94  --      Estimated Creatinine Clearance: 59.4 mL/min (by C-G formula based on SCr of 0.94 mg/dL).  Assessment: 71 yo female s/p Rt ICA stenting for heparin Goal of Therapy:  Heparin level 0.1-0.25 units/ml Monitor platelets by anticoagulation protocol: Yes   Plan:  Increase Heparin  750 units/hr  01-18-1975, PharmD, BCPS  12/23/2021, 11:20 PM

## 2021-12-23 NOTE — Anesthesia Procedure Notes (Signed)
Procedure Name: Intubation Date/Time: 12/23/2021 10:00 AM Performed by: Lavell Luster, CRNA Pre-anesthesia Checklist: Patient identified, Emergency Drugs available, Suction available, Patient being monitored and Timeout performed Patient Re-evaluated:Patient Re-evaluated prior to induction Induction Type: IV induction Ventilation: Mask ventilation without difficulty Laryngoscope Size: Mac and 3 Grade View: Grade I Tube size: 7.5 mm Number of attempts: 1 Airway Equipment and Method: Stylet Placement Confirmation: ETT inserted through vocal cords under direct vision, positive ETCO2 and breath sounds checked- equal and bilateral Secured at: 22 cm Tube secured with: Tape Dental Injury: Teeth and Oropharynx as per pre-operative assessment

## 2021-12-23 NOTE — Progress Notes (Signed)
Pt's A line has varying reading  SBP 105-115's. With Map [phone]) when she is asleep; Upon stimulation pt's SBP jumps to 120-125 SBP. Pt has had no neuro change; R fem groin site has had no new drainage; Cleviprex was turned off 30 min's ago; MD Amada Jupiter made aware; New orders given: SBP's OK above 100's, as long as pt has had no neuro changes and pt to be given a 500 ml NS 9% bolus; This RN will carry out orders and continue monitoring pt.   Jannifer Hick, RN

## 2021-12-23 NOTE — Progress Notes (Signed)
PT Cancellation Note  Patient Details Name: Veronica Small MRN: [id number] DOB: [birth year]   Cancelled Treatment:    Reason Eval/Treat Not Completed: Patient at procedure or test/unavailable; pt was at IR for cerebral angiogram. Will attempt later if schedule permits.  Armanda Heritage, SPT    Armanda Heritage 12/23/2021, 10:48 AM

## 2021-12-23 NOTE — Progress Notes (Signed)
Cuff BP in R arm varies greatly from L art line BP.    Plan to titrate cleviprex based on art line pressures instead of cuff pressures.

## 2021-12-23 NOTE — Progress Notes (Signed)
Chief Complaint: Patient was seen today for ICA stensois   Supervising Physician: Julieanne Cotton  Patient Status: [facility] - In-pt  Subjective: Pt scheduled for (R)ICA stent angioplasty today. Seen yesterday full consult. No changes overnight. Pt has received medications as instructed.  Objective: Physical Exam: BP (!) 158/62    Pulse 62    Temp 99.4 F (37.4 C) (Oral)    Resp 16    Ht 5\' 6"  (1.676 m)    Wt 80.3 kg    SpO2 95%    BMI 28.57 kg/m  General: A&O NAD Lungs: CTA without w/r/r Heart: Regular Neuro intact Ext: Excellent femoral and pedal pulses bilat    Current Facility-Administered Medications:    0.9 %  sodium chloride infusion, , Intravenous, Continuous, Marvel Plan, MD, Last Rate: 50 mL/hr at 12/23/21 0005, New Bag at 12/23/21 0005   0.9 %  sodium chloride infusion, , Intravenous, Continuous, Heather Roberts, MD   [MAR Hold] acetaminophen (TYLENOL) tablet 650 mg, 650 mg, Oral, Q6H PRN **OR** [MAR Hold] acetaminophen (TYLENOL) suppository 650 mg, 650 mg, Rectal, Q6H PRN, Ronaldo Miyamoto, Tyrone A, DO   [MAR Hold] ALPRAZolam (XANAX) tablet 0.5 mg, 0.5 mg, Oral, TID, Kyle, Tyrone A, DO, 0.5 mg at 12/23/21 0841   [MAR Hold] aspirin EC tablet 81 mg, 81 mg, Oral, Daily, Kyle, Tyrone A, DO, 81 mg at 12/23/21 0841   [MAR Hold] doxycycline (VIBRAMYCIN) 100 mg in sodium chloride 0.9 % 250 mL IVPB, 100 mg, Intravenous, Q12H, Kyle, Tyrone A, DO, Last Rate: 125 mL/hr at 12/22/21 2302, Restarted at 12/22/21 2302   [MAR Hold] FLUoxetine (PROZAC) capsule 40 mg, 40 mg, Oral, Daily, Kyle, Tyrone A, DO, 40 mg at 12/23/21 0841   [MAR Hold] HYDROcodone-acetaminophen (NORCO/VICODIN) 5-325 MG per tablet 1-2 tablet, 1-2 tablet, Oral, Q4H PRN, Ronaldo Miyamoto, Tyrone A, DO   [MAR Hold] pantoprazole (PROTONIX) EC tablet 40 mg, 40 mg, Oral, Daily, Kyle, Tyrone A, DO, 40 mg at 12/23/21 0841   [MAR Hold] pravastatin (PRAVACHOL) tablet 40 mg, 40 mg, Oral, Daily, Kyle, Tyrone A, DO, 40 mg at 12/23/21 0841   [MAR  Hold] ticagrelor (BRILINTA) tablet 90 mg, 90 mg, Oral, BID, Covington, Jamie R, NP, 90 mg at 12/23/21 0841   [MAR Hold] vancomycin (VANCOCIN) IVPB 1000 mg/200 mL premix, 1,000 mg, Intravenous, to Kathlen Brunswick, NP  Labs: CBC Recent Labs    12/22/21 0526 12/23/21 0337  WBC 5.0 6.2  HGB 10.7* 11.1*  HCT 32.8* 33.8*  PLT 173 203   BMET Recent Labs    12/22/21 0345 12/23/21 0337  NA 137 136  K 3.5 3.8  CL 108 109  CO2 20* 22  GLUCOSE 111* 106*  BUN 8 8  CREATININE 0.91 0.94  CALCIUM 8.0* 8.0*   LFT Recent Labs    12/22/21 0345  PROT 5.1*  ALBUMIN 2.8*  AST 21  ALT 17  ALKPHOS 57  BILITOT 0.5   PT/INR Recent Labs    12/22/21 0345 12/23/21 0337  LABPROT 13.9 13.2  INR 1.1 1.0     Studies/Results: CT ANGIO HEAD NECK W WO CM  Result Date: 12/22/2021 CLINICAL DATA:  Stroke workup EXAM: CT ANGIOGRAPHY HEAD AND NECK TECHNIQUE: Multidetector CT imaging of the head and neck was performed using the standard protocol during bolus administration of intravenous contrast. Multiplanar CT image reconstructions and MIPs were obtained to evaluate the vascular anatomy. Carotid stenosis measurements (when applicable) are obtained utilizing NASCET criteria, using the distal internal carotid diameter  as the denominator. RADIATION DOSE REDUCTION: This exam was performed according to the departmental dose-optimization program which includes automated exposure control, adjustment of the mA and/or kV according to patient size and/or use of iterative reconstruction technique. CONTRAST:  25mL OMNIPAQUE IOHEXOL 350 MG/ML SOLN COMPARISON:  Brain MRI from yesterday FINDINGS: CT HEAD FINDINGS Brain: Known small acute infarcts along the cortex at the high right cerebrum. No hemorrhage, hydrocephalus, or gross progression. Vascular: See below Skull: No acute finding Sinuses: Small presumed retention cyst in the left maxillary sinus. Orbits: Unremarkable Review of the MIP images confirms the  above findings CTA NECK FINDINGS Aortic arch: Atheromatous calcifications. Three vessel branching. No acute finding. Right carotid system: Motion artifact again complicates evaluation of the carotid bifurcations. Still, there is better visualization of the bifurcation where there is mixed density plaque with proximal ICA luminal narrowing to less than 1 mm, 80-90% stenosis based on sagittal reformats. Bifurcation is below the right mandibular angle. On axial slices there is a seemingly luminal low-density projection suggesting thrombus, although less apparent on sagittal images. Left carotid system: Motion artifact limits evaluation of the bifurcation. There is mixed density, heavily calcified plaque the bifurcation with proximal ICA stenosis estimated at over 75%. No ulceration or dissection seen. Vertebral arteries: Proximal subclavian atherosclerosis without flow limiting stenosis. The left vertebral artery is dominant. Skeleton: No emergent finding. Asymmetric left degenerative facet spurring Other neck: No acute finding Upper chest: No acute finding Review of the MIP images confirms the above findings CTA HEAD FINDINGS Anterior circulation: Atheromatous plaque on the carotid siphons. No major branch occlusion, flow limiting stenosis, aneurysm, or vascular malformation. Posterior circulation: Left dominant vertebral artery. Right vertebral artery ends in PICA. No branch occlusion, beading, or aneurysm. Venous sinuses: Patent Anatomic variants: None significant Review of the MIP images confirms the above findings IMPRESSION: Exam is again complicated by patient motion degrading visualization of the carotid bifurcations, but improved from before and showing definite flow reducing stenosis at both proximal ICA. Worse disease on the right where proximal ICA narrowing measures 80-90% and there may be some luminal clot. 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 No gross progression and no interval  hemorrhage. Electronically Signed   By: Jorje Guild M.D.   On: 12/22/2021 05:25   MR BRAIN WO CONTRAST  Result Date: 12/21/2021 CLINICAL DATA:  Neuro deficit, acute, stroke suspected. Near syncopal episode. Tremors. Slurred speech. Facial droop. EXAM: MRI HEAD WITHOUT CONTRAST TECHNIQUE: Multiplanar, multiecho pulse sequences of the brain and surrounding structures were obtained without intravenous contrast. COMPARISON:  CT studies earlier same day.  MRI 01/03/2009. FINDINGS: Brain: No abnormality affects the brainstem or cerebellum. Left cerebral hemisphere shows minimal chronic small-vessel change of the white matter. Right hemisphere shows numerous acute infarctions affecting the cortical brain in the right middle cerebral artery territory consistent with micro embolic infarctions. No large or confluent infarction. No mass effect or hemorrhage. Mild chronic small-vessel change affects the deep white matter of the right hemisphere as well as the left. No hydrocephalus or extra-axial collection. No mass lesion. Vascular: Major vessels at the base of the brain show flow. 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 No large confluent infarction. No sign of hemorrhage or mass effect. Otherwise, the brain has a good appearance for age, with only mild chronic small-vessel ischemic changes of the cerebral hemispheric white matter. Electronically Signed   By:  Nelson Chimes M.D.   On: 12/21/2021 10:09   ECHOCARDIOGRAM COMPLETE  Result Date: 12/21/2021    ECHOCARDIOGRAM REPORT   Patient Name:   JALEAH PRIOR Date of Exam: 12/21/2021 Medical Rec #:  [id number]        Height:       66.0 in Accession #:    [id number]       Weight:       177.0 lb Date of Birth:  [birth year]        BSA:          1.899 m Patient Age:    71 years         BP:           156/84 mmHg Patient Gender:  F                HR:           75 bpm. Exam Location:  Inpatient Procedure: 2D Echo, Cardiac Doppler and Color Doppler Indications:    Stroke  History:        Patient has no prior history of Echocardiogram examinations.                 Risk Factors:Former Smoker.  Sonographer:    Glo Herring Referring Phys: [id number] [place]  1. Left ventricular ejection fraction, by estimation, is 60 to 65%. The left ventricle has normal function. The left ventricle has no regional wall motion abnormalities. Left ventricular diastolic parameters were normal.  2. Right ventricular systolic function is normal. The right ventricular size is normal. Tricuspid regurgitation signal is inadequate for assessing PA pressure.  3. The mitral valve is normal in structure. No evidence of mitral valve regurgitation. No evidence of mitral stenosis.  4. The aortic valve was not well visualized. Aortic valve regurgitation is not visualized. No aortic stenosis is present. Comparison(s): No prior Echocardiogram. Conclusion(s)/Recommendation(s): No intracardiac source of embolism detected on this transthoracic study. Consider a transesophageal echocardiogram to exclude cardiac source of embolism if clinically indicated. FINDINGS  Left Ventricle: Left ventricular ejection fraction, by estimation, is 60 to 65%. The left ventricle has normal function. The left ventricle has no regional wall motion abnormalities. The left ventricular internal cavity size was normal in size. There is  no left ventricular hypertrophy. Left ventricular diastolic parameters were normal. Right Ventricle: Calcified papillary muscle. The right ventricular size is normal. No increase in right ventricular wall thickness. Right ventricular systolic function is normal. Tricuspid regurgitation signal is inadequate for assessing PA pressure. Left Atrium: Left atrial size was normal in size. Right Atrium: Right atrial size was normal in size. Pericardium: There is  no evidence of pericardial effusion. Mitral Valve: The mitral valve is normal in structure. No evidence of mitral valve regurgitation. No evidence of mitral valve stenosis. Tricuspid Valve: The tricuspid valve is normal in structure. Tricuspid valve regurgitation is not demonstrated. No evidence of tricuspid stenosis. Aortic Valve: The aortic valve was not well visualized. Aortic valve regurgitation is not visualized. No aortic stenosis is present. Aortic valve mean gradient measures 4.5 mmHg. Aortic valve peak gradient measures 8.4 mmHg. Aortic valve area, by VTI measures 2.01 cm. Pulmonic Valve: The pulmonic valve was normal in structure. Pulmonic valve regurgitation is not visualized. No evidence of pulmonic stenosis. Aorta: The aortic root and ascending aorta are structurally normal, with no evidence of dilitation. Venous: The inferior vena cava was not well visualized. IAS/Shunts: No atrial level shunt detected by color flow  Doppler.  LEFT VENTRICLE PLAX 2D LVIDd:         4.70 cm   Diastology LVIDs:         3.10 cm   LV e' medial:    9.46 cm/s LV PW:         1.00 cm   LV E/e' medial:  11.7 LV IVS:        1.00 cm   LV e' lateral:   10.60 cm/s LVOT diam:     1.80 cm   LV E/e' lateral: 10.5 LV SV:         66 LV SV Index:   35 LVOT Area:     2.54 cm  RIGHT VENTRICLE RV S prime:     12.30 cm/s LEFT ATRIUM           Index LA diam:      4.30 cm 2.26 cm/m LA Vol (A2C): 37.4 ml 19.70 ml/m  AORTIC VALVE                     PULMONIC VALVE AV Area (Vmax):    2.02 cm      PV Vmax:       1.13 m/s AV Area (Vmean):   2.08 cm      PV Peak grad:  5.1 mmHg AV Area (VTI):     2.01 cm AV Vmax:           145.00 cm/s AV Vmean:          100.400 cm/s AV VTI:            0.328 m AV Peak Grad:      8.4 mmHg AV Mean Grad:      4.5 mmHg LVOT Vmax:         115.00 cm/s LVOT Vmean:        81.900 cm/s LVOT VTI:          0.259 m LVOT/AV VTI ratio: 0.79  AORTA Ao Root diam: 2.90 cm Ao Asc diam:  3.00 cm MITRAL VALVE MV Area (PHT): 5.09 cm      SHUNTS MV Decel Time: 149 msec     Systemic VTI:  0.26 m MV E velocity: 111.00 cm/s  Systemic Diam: 1.80 cm MV A velocity: 123.00 cm/s MV E/A ratio:  0.90 Rudean Haskell MD Electronically signed by Rudean Haskell MD Signature Date/Time: 12/21/2021/3:03:59 PM    Final    VAS US CAROTID (at [facility] and [facility] only)  Result Date: 12/23/2021 Carotid Arterial Duplex Study Patient Name:  FEY DORSI  Date of Exam:   12/22/2021 Medical Rec #: [id number]         Accession #:    [id number] Date of Birth: 1951-04-17         Patient Gender: F Patient Age:   58 years Exam Location:  [facility] Procedure:      VAS US CAROTID Referring Phys: Cherylann Ratel --------------------------------------------------------------------------------  Indications:       CVA and Carotid artery disease. Risk Factors:      Hyperlipidemia. Comparison Study:  No prior study Performing Technologist: Maudry Mayhew MHA, RDMS, RVT, RDCS  Examination Guidelines: A complete evaluation includes B-mode imaging, spectral Doppler, color Doppler, and power Doppler as needed of all accessible portions of each vessel. Bilateral testing is considered an integral part of a complete examination. Limited examinations for reoccurring indications may be performed as noted.  Right Carotid Findings: +----------+--------+--------+--------+-------------------------+---------+  PSV cm/s EDV cm/s Stenosis Plaque Description        Comments   +----------+--------+--------+--------+-------------------------+---------+  CCA Prox   70       12                                                     +----------+--------+--------+--------+-------------------------+---------+  CCA Distal 66       15                calcific and heterogenous Shadowing  +----------+--------+--------+--------+-------------------------+---------+  ICA Prox   220      71       60-79%   calcific and heterogenous Shadowing   +----------+--------+--------+--------+-------------------------+---------+  ICA Mid    105      26                                                     +----------+--------+--------+--------+-------------------------+---------+  ICA Distal 109      33                                                     +----------+--------+--------+--------+-------------------------+---------+  ECA        453      88       >50%                                          +----------+--------+--------+--------+-------------------------+---------+ +----------+--------+-------+----------------+-------------------+             PSV cm/s EDV cms Describe         Arm Pressure (mmHG)  +----------+--------+-------+----------------+-------------------+  Subclavian 125              Multiphasic, WNL                      +----------+--------+-------+----------------+-------------------+ +---------+--------+--+--------+--+---------+  Vertebral PSV cm/s 59 EDV cm/s 14 Antegrade  +---------+--------+--+--------+--+---------+  Left Carotid Findings: +----------+--------+--------+--------+------------------------------+---------+             PSV cm/s EDV cm/s Stenosis Plaque Description             Comments   +----------+--------+--------+--------+------------------------------+---------+  CCA Prox   93       24                                                          +----------+--------+--------+--------+------------------------------+---------+  CCA Distal 82       21                irregular and calcific         Shadowing  +----------+--------+--------+--------+------------------------------+---------+  ICA Prox   289      74       60-79%   heterogenous, irregular and    Shadowing  calcific                                  +----------+--------+--------+--------+------------------------------+---------+  ICA Mid    205      51                                                           +----------+--------+--------+--------+------------------------------+---------+  ICA Distal 90       18                                                          +----------+--------+--------+--------+------------------------------+---------+  ECA        227      23                                                          +----------+--------+--------+--------+------------------------------+---------+ +----------+--------+--------+----------------+-------------------+             PSV cm/s EDV cm/s Describe         Arm Pressure (mmHG)  +----------+--------+--------+----------------+-------------------+  Subclavian 107               Multiphasic, WNL                      +----------+--------+--------+----------------+-------------------+ +---------+--------+--+--------+--+---------+  Vertebral PSV cm/s 93 EDV cm/s 23 Antegrade  +---------+--------+--+--------+--+---------+   Summary: Right Carotid: Velocities in the right ICA are consistent with a 60-79%                stenosis. The ECA appears >50% stenosed. Left Carotid: Velocities in the left ICA are consistent with a 60-79% stenosis. Vertebrals:  Bilateral vertebral arteries demonstrate antegrade flow. Subclavians: Normal flow hemodynamics were seen in bilateral subclavian              arteries. *See table(s) above for measurements and observations.  Electronically signed by Antony Contras MD on 12/23/2021 at 8:17:38 AM.    Final    VAS [place] LOWER EXTREMITY VENOUS (DVT)  Result Date: 12/22/2021  Lower Venous DVT Study Patient Name:  SAMAR CLEATON  Date of Exam:   12/22/2021 Medical Rec #: [id number]         Accession #:    [id number] Date of Birth: [birth year]         Patient Gender: F Patient Age:   45 years Exam Location:  [facility] Procedure:      VAS [place] LOWER EXTREMITY VENOUS (DVT) Referring Phys: Cornelius Moras XU --------------------------------------------------------------------------------  Indications: Stroke.  Comparison Study: No prior study Performing  Technologist: Maudry Mayhew MHA, RDMS, RVT, RDCS  Examination Guidelines: A complete evaluation includes B-mode imaging, spectral Doppler, color Doppler, and power Doppler as needed of all accessible portions of each vessel. Bilateral testing is considered an integral part of a complete examination. Limited examinations for reoccurring indications may be performed as noted. The reflux portion of the exam  is performed with the patient in reverse Trendelenburg.  +---------+---------------+---------+-----------+----------+--------------+  RIGHT     Compressibility Phasicity Spontaneity Properties Thrombus Aging  +---------+---------------+---------+-----------+----------+--------------+  CFV       Full            Yes       Yes                                    +---------+---------------+---------+-----------+----------+--------------+  SFJ       Full                                                             +---------+---------------+---------+-----------+----------+--------------+  FV Prox   Full                                                             +---------+---------------+---------+-----------+----------+--------------+  FV Mid    Full                                                             +---------+---------------+---------+-----------+----------+--------------+  FV Distal Full                                                             +---------+---------------+---------+-----------+----------+--------------+  PFV       Full                                                             +---------+---------------+---------+-----------+----------+--------------+  POP       Full            Yes       Yes                                    +---------+---------------+---------+-----------+----------+--------------+  PTV       Full                                                             +---------+---------------+---------+-----------+----------+--------------+  PERO      Full                                                              +---------+---------------+---------+-----------+----------+--------------+   +---------+---------------+---------+-----------+----------+--------------+  LEFT      Compressibility Phasicity Spontaneity Properties Thrombus Aging  +---------+---------------+---------+-----------+----------+--------------+  CFV       Full            Yes       Yes                                    +---------+---------------+---------+-----------+----------+--------------+  SFJ       Full                                                             +---------+---------------+---------+-----------+----------+--------------+  FV Prox   Full                                                             +---------+---------------+---------+-----------+----------+--------------+  FV Mid    Full                                                             +---------+---------------+---------+-----------+----------+--------------+  FV Distal Full                                                             +---------+---------------+---------+-----------+----------+--------------+  PFV       Full                                                             +---------+---------------+---------+-----------+----------+--------------+  POP       Full            Yes       Yes                                    +---------+---------------+---------+-----------+----------+--------------+  PTV       Full                                                             +---------+---------------+---------+-----------+----------+--------------+  PERO      Full                                                             +---------+---------------+---------+-----------+----------+--------------+  Summary: RIGHT: - There is no evidence of deep vein thrombosis in the lower extremity.  - No cystic structure found in the popliteal fossa.  LEFT: - There is no evidence of deep vein thrombosis in the lower extremity.  - No cystic  structure found in the popliteal fossa.  *See table(s) above for measurements and observations. Electronically signed by Harold Barban MD on 12/22/2021 at 9:16:30 PM.    Final     Assessment/Plan: Right ICA stenosis For angiogram with intended angioplasty/stent under general anesthesia. Reviewed procedure, risks, complications, and post op plans with pt again in detail. She is agreeable to proceed. Sign consent already in chart.    LOS: 2 days   Ascencion Dike PA-C 12/23/2021 9:38 AM

## 2021-12-23 NOTE — Procedures (Signed)
INR.  4 vessel cerebral arteriogram. RT CFA approach. Findings. 1.Severe Prox Rt ICA stenosis. S/P revascularization with stent assisted angioplasty with distal protection. Lt ICA prox 50 to 60 % stenosis  associated with a small ulceration. Post CT No ICH . 37F angioseal for RT CFA  hemostasis . Distal pulses all intact. Extubated  Maintaining O2 sats. Pupils 37mm RT = Lt No facial asymmetry. Opens eyes to name. Moves all 4s proprtional to effort. S.Chenille Toor MD

## 2021-12-23 NOTE — Transfer of Care (Signed)
Immediate Anesthesia Transfer of Care Note  Patient: Veronica Small  Procedure(s) Performed: Cerebral angiogram with angioplasty and possible stenting  Patient Location: PACU  Anesthesia Type:General  Level of Consciousness: awake, alert  and sedated  Airway & Oxygen Therapy: Patient connected to face mask oxygen  Post-op Assessment: Post -op Vital signs reviewed and stable  Post vital signs: stable  Last Vitals:  Vitals Value Taken Time  BP 118/61 12/23/21 1254  Temp    Pulse 71 12/23/21 1259  Resp 16 12/23/21 1259  SpO2 95 % 12/23/21 1259  Vitals shown include unvalidated device data.  Last Pain:  Vitals:   12/23/21 0920  TempSrc: Oral  PainSc: 0-No pain         Complications: No notable events documented.

## 2021-12-23 NOTE — Progress Notes (Signed)
PROGRESS NOTE  Veronica Small  DOB: [birth year]  PCP: Renford DillsPolite, Ronald, MD ZOX:[id number]  DOA: 12/20/2021  LOS: 2 days  Hospital Day: 4  Chief Complaint  Patient presents with   Medication Reaction   Brief narrative: Veronica Small is a 71 y.o. female with PMH significant for HLD, anxiety/depression, GERD who presented to the ED on 12/20/2021 with left upper extremity weakness, numbness and heaviness. She apparently was scratched on her right forearm by her cat a couple of days ago after which she developed a rash and pain.  She was started on a course of Keflex by her PCP.  Patient tends to relate her symptoms of weakness to Keflex.  In the ED, teleneurology was consulted. CT head did not show any acute intracranial pathology. Admitted for stroke work-up Neurology consulted. MRI brain showed numerous small acute microembolic infarcts in the right MCA territory with no sign of hemorrhage or mass-effect.  It also showed chronic small vessel ischemic changes CT angio of head and neck showed bilateral carotid artery stenosis with right proximal ICA narrowing 80 to 90% with the possibility of luminal clot.  Subjective: Patient was seen and examined this morning.  Sitting up at the edge of the bed.  Not in distress.  She was waiting for carotid surgery today.  Assessment/Plan: Acute stroke -Presented with left upper extremity weakness -MRI brain with numerous small acute microembolic infarcts in the right MCA territory -Stroke work-up ongoing -Echo with EF 60 to 65%,, no atrial level shunt. -LDL 46, A1c 5.9. -Prior to admission, patient was on pravastatin 40 mg daily. -Currently on aspirin 81 mg daily, Brilinta and pravastatin. -PT/OT eval eval obtained.  No follow-up recommended.  Bilateral carotid disease -With right ICA 80 to 90% stenosis -Vascular surgery consulted.  Noted a plan for carotid stenting today by neuro IR. -Antiplatelets and statin as above  Sepsis secondary to  right upper extremity cellulitis -Patient was scratched on her right forearm by her cat a couple of days ago after which she developed a rash and pain.  She was started on a course of Keflex by her PCP.   -She presented with leukocytosis, significantly elevated lactic acid level.   -Antibiotics was switched to doxycycline on admission.   -Cellulitis seems to be improving. -No fever, WBC count to repeat tomorrow.  Lactic acid level seem to have normalized -Follow-up blood culture report. Recent Labs  Lab 12/21/21 0050 12/21/21 0254 12/21/21 1644 12/21/21 1843 12/22/21 0345 12/22/21 0526 12/23/21 0337  WBC 13.9*  --   --   --   --  5.0 6.2  LATICACIDVEN 3.6* 2.8* 1.1 1.2  --   --   --   PROCALCITON  --   --   --   --  0.53  --   --     Hyperlipidemia -Pravastatin  Anxiety/ Depression -Continue Xanax, Prozac, Abilify, trazodone  GERD -PPI  Mobility: Encourage ambulation Living condition: Lives at home Goals of care:   Code Status: Full Code  Nutritional status: Body mass index is 28.57 kg/m.      Diet:  Diet Order             Diet NPO time specified Except for: Sips with Meds  Diet effective midnight                  DVT prophylaxis:  SCDs Start: 12/21/21 1636   Antimicrobials: Doxycycline Fluid: None Consultants: Neurology Family Communication: None at bedside  Status is: Inpatient  Continue in-hospital care because: Pending carotid stenting today Level of care: Telemetry Medical   Dispo: The patient is from: Home              Anticipated d/c is to: Pending carotid stenting              Patient currently is not medically stable to d/c.   Difficult to place patient No     Infusions:   sodium chloride 50 mL/hr at 12/23/21 0951   sodium chloride     [MAR Hold] doxycycline (VIBRAMYCIN) IV 125 mL/hr at 12/22/21 2302   [MAR Hold] vancomycin      Scheduled Meds:  [MAR Hold] ALPRAZolam  0.5 mg Oral TID   [MAR Hold] aspirin EC  81 mg Oral Daily    eptifibatide       [MAR Hold] FLUoxetine  40 mg Oral Daily   [MAR Hold] pantoprazole  40 mg Oral Daily   [MAR Hold] pravastatin  40 mg Oral Daily   [MAR Hold] ticagrelor  90 mg Oral BID    PRN meds: [MAR Hold] acetaminophen **OR** [MAR Hold] acetaminophen, [MAR Hold] HYDROcodone-acetaminophen, iohexol, iohexol   Antimicrobials: Anti-infectives (From admission, onward)    Start     Dose/Rate Route Frequency Ordered Stop   12/23/21 0000  [MAR Hold]  vancomycin (VANCOCIN) IVPB 1000 mg/200 mL premix        (MAR Hold since Thu 12/23/2021 at 0909.Hold Reason: Transfer to a Procedural area)   1,000 mg 200 mL/hr over 60 Minutes Intravenous To Radiology 12/22/21 1422 12/24/21 0000   12/21/21 1000  [MAR Hold]  doxycycline (VIBRAMYCIN) 100 mg in sodium chloride 0.9 % 250 mL IVPB        (MAR Hold since Thu 12/23/2021 at 0909.Hold Reason: Transfer to a Procedural area)   100 mg 125 mL/hr over 120 Minutes Intravenous Every 12 hours 12/21/21 0933     12/21/21 0600  Ampicillin-Sulbactam (UNASYN) 3 g in sodium chloride 0.9 % 100 mL IVPB  Status:  Discontinued        3 g 200 mL/hr over 30 Minutes Intravenous Every 8 hours 12/21/21 0530 12/21/21 0933   12/21/21 0015  ciprofloxacin (CIPRO) IVPB 400 mg        400 mg 200 mL/hr over 60 Minutes Intravenous  Once 12/21/21 0005 12/21/21 0502   12/21/21 0015  sulfamethoxazole-trimethoprim (BACTRIM DS) 800-160 MG per tablet 1 tablet        1 tablet Oral  Once 12/21/21 0005 12/21/21 0223       Objective: Vitals:   12/23/21 0758 12/23/21 0920  BP: (!) 154/79 (!) 158/62  Pulse: 62 62  Resp: 10 16  Temp: 99 F (37.2 C) 99.4 F (37.4 C)  SpO2: 95%     Intake/Output Summary (Last 24 hours) at 12/23/2021 1143 Last data filed at 12/23/2021 1131 Gross per 24 hour  Intake 1000 ml  Output 800 ml  Net 200 ml    Filed Weights   12/21/21 0147  Weight: 80.3 kg   Weight change:  Body mass index is 28.57 kg/m.   Physical Exam: General exam: Pleasant,  elderly Caucasian female.  Not in physical distress Skin: No rashes, lesions or ulcers. HEENT: Atraumatic, normocephalic, no obvious bleeding Lungs: Clear to auscultation bilaterally CVS: Regular rate and rhythm, no murmur GI/Abd soft, nontender, nondistended, bowel sound present CNS: Alert, awake, oriented x3, Psychiatry: Mood appropriate Extremities: No pedal edema, no calf tenderness.  Right forearm cellulitis improving.  Data Review: I  have personally reviewed the laboratory data and studies available.  F/u labs ordered. Unresulted Labs (From admission, onward)    None       Signed, Lorin Glass, MD [facility] 12/23/2021

## 2021-12-23 NOTE — Anesthesia Procedure Notes (Signed)
Arterial Line Insertion Start/End1/26/2023 9:30 AM, 12/23/2021 9:40 AM Performed by: Dorie Rank, CRNA, CRNA  Preanesthetic checklist: patient identified, IV checked, site marked, risks and benefits discussed, surgical consent, monitors and equipment checked, pre-op evaluation and timeout performed Lidocaine 1% used for infiltration Left, radial was placed Catheter size: 20 G Hand hygiene performed , maximum sterile barriers used  and Seldinger technique used Allen's test indicative of satisfactory collateral circulation Attempts: 2 Procedure performed without using ultrasound guided technique. Following insertion, Biopatch and dressing applied. Post procedure assessment: normal  Patient tolerated the procedure well with no immediate complications.

## 2021-12-23 NOTE — Progress Notes (Signed)
STROKE TEAM PROGRESS NOTE   SUBJECTIVE (INTERVAL HISTORY) No family is at the bedside.  Pt was seen at [facility] ICU post right ICA stenting with Dr. Corliss Skains. Pt awake alert, neuro unchanged from yesterday, still complaining of left thrum numbness. Moving all extremities.    OBJECTIVE Temp:  [97.1 F (36.2 C)-99.4 F (37.4 C)] 98 F (36.7 C) (01/26 1600) Pulse Rate:  [58-73] 66 (01/26 1715) Cardiac Rhythm: Normal sinus rhythm (01/26 1500) Resp:  [10-20] 15 (01/26 1645) BP: (103-161)/(46-99) 105/49 (01/26 1700) SpO2:  [89 %-100 %] 96 % (01/26 1715) Arterial Line BP: (114-162)/(41-55) 139/47 (01/26 1715) Weight:  [80 kg] 80 kg (01/26 1410)  Recent Labs  Lab 12/21/21 0735  GLUCAP 133*   Recent Labs  Lab 12/21/21 0050 12/22/21 0345 12/23/21 0337  NA 137 137 136  K 3.8 3.5 3.8  CL 107 108 109  CO2 21* 20* 22  GLUCOSE 175* 111* 106*  BUN 14 8 8   CREATININE 1.11* 0.91 0.94  CALCIUM 8.0* 8.0* 8.0*   Recent Labs  Lab 12/21/21 0050 12/22/21 0345  AST 27 21  ALT 18 17  ALKPHOS 77 57  BILITOT 0.5 0.5  PROT 6.2* 5.1*  ALBUMIN 3.4* 2.8*   Recent Labs  Lab 12/21/21 0050 12/22/21 0526 12/23/21 0337  WBC 13.9* 5.0 6.2  NEUTROABS 12.6*  --  4.4  HGB 11.6* 10.7* 11.1*  HCT 36.8 32.8* 33.8*  MCV 87.2 85.6 84.1  PLT 210 173 203   No results for input(s): CKTOTAL, CKMB, CKMBINDEX, TROPONINI in the last 168 hours. Recent Labs    12/21/21 0050 12/22/21 0345 12/23/21 0337  LABPROT 13.9 13.9 13.2  INR 1.1 1.1 1.0   Recent Labs    12/21/21 0200  COLORURINE YELLOW  LABSPEC >1.030*  PHURINE 6.0  GLUCOSEU 250*  HGBUR NEGATIVE  BILIRUBINUR NEGATIVE  KETONESUR NEGATIVE  PROTEINUR TRACE*  NITRITE NEGATIVE  LEUKOCYTESUR NEGATIVE       Component Value Date/Time   CHOL 105 12/22/2021 0345   TRIG 63 12/22/2021 0345   HDL 46 12/22/2021 0345   CHOLHDL 2.3 12/22/2021 0345   VLDL 13 12/22/2021 0345   LDLCALC 46 12/22/2021 0345   Lab Results  Component Value Date    HGBA1C 5.9 (H) 12/22/2021      Component Value Date/Time   LABOPIA NONE DETECTED 12/21/2021 0200   COCAINSCRNUR NONE DETECTED 12/21/2021 0200   LABBENZ POSITIVE (A) 12/21/2021 0200   AMPHETMU POSITIVE (A) 12/21/2021 0200   THCU NONE DETECTED 12/21/2021 0200   LABBARB NONE DETECTED 12/21/2021 0200    Recent Labs  Lab 12/21/21 0050  ETH <10    I have personally reviewed the radiological images below and agree with the radiology interpretations.  CT Angio Head W or Wo Contrast  Result Date: 12/21/2021 CLINICAL DATA:  Stroke follow-up.  Near syncope with slurred speech EXAM: CT ANGIOGRAPHY HEAD AND NECK TECHNIQUE: Multidetector CT imaging of the head and neck was performed using the standard protocol during bolus administration of intravenous contrast. Multiplanar CT image reconstructions and MIPs were obtained to evaluate the vascular anatomy. Carotid stenosis measurements (when applicable) are obtained utilizing NASCET criteria, using the distal internal carotid diameter as the denominator. RADIATION DOSE REDUCTION: This exam was performed according to the departmental dose-optimization program which includes automated exposure control, adjustment of the mA and/or kV according to patient size and/or use of iterative reconstruction technique. CONTRAST:  80mL OMNIPAQUE IOHEXOL 350 MG/ML SOLN COMPARISON:  Head CT from earlier today FINDINGS: CTA  NECK FINDINGS Aortic arch: Atheromatous calcification with 3 vessel branching. Right carotid system: Atheromatous plaque, heavily calcified, at the bifurcation/proximal ICA but the lumen cannot be measured/assessed given the degree of laryngeal motion. Flow reducing stenosis is definitely possible. No downstream stenosis or beading. Left carotid system: Atheromatous plaque especially heavy calcification at the bifurcation and proximal ICA. Due to laryngeal artifact the lumen cannot be assessed/measured. Possible flow limiting stenosis. No evidence of  dissection. Vertebral arteries: Proximal left subclavian atheromatous plaque. No flow limiting stenosis. Dominant left vertebral artery. Both vertebral arteries are smoothly contoured and widely patent to the dura. Skeleton: Cervical spine degeneration especially affecting left-sided facets. Other neck: No acute finding.  Motion artifact Upper chest: Clear apical lungs. Review of the MIP images confirms the above findings CTA HEAD FINDINGS Anterior circulation: Atheromatous calcification of the carotid siphons. No branch occlusion, beading, or aneurysm. Posterior circulation: Vertebral and basilar arteries are smoothly contoured and widely patent. Left vertebral artery is dominant right vertebral artery ending in PICA. No branch occlusion, beading, or aneurysm. Venous sinuses: Diffusely patent Anatomic variants: None significant Review of the MIP images confirms the above findings IMPRESSION: 1. No emergent finding. No evidence of vertebrobasilar insufficiency. 2. Most notable atheromatous plaque is at the bilateral carotid bifurcation. Nondiagnostic assessment of associated stenoses given patient motion, flow reducing stenosis could be present on either side and follow-up for carotid Doppler or repeat CTA recommended. Electronically Signed   By: Tiburcio PeaJonathan  Watts M.D.   On: 12/21/2021 06:11   CT ANGIO HEAD NECK W WO CM  Result Date: 12/22/2021 CLINICAL DATA:  Stroke workup EXAM: CT ANGIOGRAPHY HEAD AND NECK TECHNIQUE: Multidetector CT imaging of the head and neck was performed using the standard protocol during bolus administration of intravenous contrast. Multiplanar CT image reconstructions and MIPs were obtained to evaluate the vascular anatomy. Carotid stenosis measurements (when applicable) are obtained utilizing NASCET criteria, using the distal internal carotid diameter as the denominator. RADIATION DOSE REDUCTION: This exam was performed according to the departmental dose-optimization program which  includes automated exposure control, adjustment of the mA and/or kV according to patient size and/or use of iterative reconstruction technique. CONTRAST:  75mL OMNIPAQUE IOHEXOL 350 MG/ML SOLN COMPARISON:  Brain MRI from yesterday FINDINGS: CT HEAD FINDINGS Brain: Known small acute infarcts along the cortex at the high right cerebrum. No hemorrhage, hydrocephalus, or gross progression. Vascular: See below Skull: No acute finding Sinuses: Small presumed retention cyst in the left maxillary sinus. Orbits: Unremarkable Review of the MIP images confirms the above findings CTA NECK FINDINGS Aortic arch: Atheromatous calcifications. Three vessel branching. No acute finding. Right carotid system: Motion artifact again complicates evaluation of the carotid bifurcations. Still, there is better visualization of the bifurcation where there is mixed density plaque with proximal ICA luminal narrowing to less than 1 mm, 80-90% stenosis based on sagittal reformats. Bifurcation is below the right mandibular angle. On axial slices there is a seemingly luminal low-density projection suggesting thrombus, although less apparent on sagittal images. Left carotid system: Motion artifact limits evaluation of the bifurcation. There is mixed density, heavily calcified plaque the bifurcation with proximal ICA stenosis estimated at over 75%. No ulceration or dissection seen. Vertebral arteries: Proximal subclavian atherosclerosis without flow limiting stenosis. The left vertebral artery is dominant. Skeleton: No emergent finding. Asymmetric left degenerative facet spurring Other neck: No acute finding Upper chest: No acute finding Review of the MIP images confirms the above findings CTA HEAD FINDINGS Anterior circulation: Atheromatous plaque on the carotid siphons.  No major branch occlusion, flow limiting stenosis, aneurysm, or vascular malformation. Posterior circulation: Left dominant vertebral artery. Right vertebral artery ends in PICA. No  branch occlusion, beading, or aneurysm. Venous sinuses: Patent Anatomic variants: None significant Review of the MIP images confirms the above findings IMPRESSION: Exam is again complicated by patient motion degrading visualization of the carotid bifurcations, but improved from before and showing definite flow reducing stenosis at both proximal ICA. Worse disease on the right where proximal ICA narrowing measures 80-90% and there may be some luminal clot. 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 No gross progression and no interval hemorrhage. Electronically Signed   By: Tiburcio PeaJonathan  Watts M.D.   On: 12/22/2021 05:25   CT HEAD WO CONTRAST  Result Date: 12/21/2021 CLINICAL DATA:  Transient ischemic attack. EXAM: CT HEAD WITHOUT CONTRAST TECHNIQUE: Contiguous axial images were obtained from the base of the skull through the vertex without intravenous contrast. RADIATION DOSE REDUCTION: This exam was performed according to the departmental dose-optimization program which includes automated exposure control, adjustment of the mA and/or kV according to patient size and/or use of iterative reconstruction technique. COMPARISON:  Head CT dated 01/02/2009. FINDINGS: Brain: Mild age-related atrophy and chronic microvascular ischemic changes. There is no acute intracranial hemorrhage. No mass effect or midline shift. No extra-axial fluid collection. Vascular: No hyperdense vessel or unexpected calcification. Skull: Normal. Negative for fracture or focal lesion. Sinuses/Orbits: There is mucoperiosteal thickening of paranasal sinuses. No air-fluid level. The mastoid air cells are clear. Other: None IMPRESSION: 1. No acute intracranial pathology. 2. Mild age-related atrophy and chronic microvascular ischemic changes. Electronically Signed   By: Elgie CollardArash  Radparvar M.D.   On: 12/21/2021 01:35   CT Angio Neck W and/or Wo Contrast  Result Date: 12/21/2021 CLINICAL DATA:  Stroke follow-up.  Near syncope with  slurred speech EXAM: CT ANGIOGRAPHY HEAD AND NECK TECHNIQUE: Multidetector CT imaging of the head and neck was performed using the standard protocol during bolus administration of intravenous contrast. Multiplanar CT image reconstructions and MIPs were obtained to evaluate the vascular anatomy. Carotid stenosis measurements (when applicable) are obtained utilizing NASCET criteria, using the distal internal carotid diameter as the denominator. RADIATION DOSE REDUCTION: This exam was performed according to the departmental dose-optimization program which includes automated exposure control, adjustment of the mA and/or kV according to patient size and/or use of iterative reconstruction technique. CONTRAST:  80mL OMNIPAQUE IOHEXOL 350 MG/ML SOLN COMPARISON:  Head CT from earlier today FINDINGS: CTA NECK FINDINGS Aortic arch: Atheromatous calcification with 3 vessel branching. Right carotid system: Atheromatous plaque, heavily calcified, at the bifurcation/proximal ICA but the lumen cannot be measured/assessed given the degree of laryngeal motion. Flow reducing stenosis is definitely possible. No downstream stenosis or beading. Left carotid system: Atheromatous plaque especially heavy calcification at the bifurcation and proximal ICA. Due to laryngeal artifact the lumen cannot be assessed/measured. Possible flow limiting stenosis. No evidence of dissection. Vertebral arteries: Proximal left subclavian atheromatous plaque. No flow limiting stenosis. Dominant left vertebral artery. Both vertebral arteries are smoothly contoured and widely patent to the dura. Skeleton: Cervical spine degeneration especially affecting left-sided facets. Other neck: No acute finding.  Motion artifact Upper chest: Clear apical lungs. Review of the MIP images confirms the above findings CTA HEAD FINDINGS Anterior circulation: Atheromatous calcification of the carotid siphons. No branch occlusion, beading, or aneurysm. Posterior circulation:  Vertebral and basilar arteries are smoothly contoured and widely patent. Left vertebral artery is dominant right vertebral artery ending in PICA. No branch  occlusion, beading, or aneurysm. Venous sinuses: Diffusely patent Anatomic variants: None significant Review of the MIP images confirms the above findings IMPRESSION: 1. No emergent finding. No evidence of vertebrobasilar insufficiency. 2. Most notable atheromatous plaque is at the bilateral carotid bifurcation. Nondiagnostic assessment of associated stenoses given patient motion, flow reducing stenosis could be present on either side and follow-up for carotid Doppler or repeat CTA recommended. Electronically Signed   By: Tiburcio Pea M.D.   On: 12/21/2021 06:11   MR BRAIN WO CONTRAST  Result Date: 12/21/2021 CLINICAL DATA:  Neuro deficit, acute, stroke suspected. Near syncopal episode. Tremors. Slurred speech. Facial droop. EXAM: MRI HEAD WITHOUT CONTRAST TECHNIQUE: Multiplanar, multiecho pulse sequences of the brain and surrounding structures were obtained without intravenous contrast. COMPARISON:  CT studies earlier same day.  MRI 01/03/2009. FINDINGS: Brain: No abnormality affects the brainstem or cerebellum. Left cerebral hemisphere shows minimal chronic small-vessel change of the white matter. Right hemisphere shows numerous acute infarctions affecting the cortical brain in the right middle cerebral artery territory consistent with micro embolic infarctions. No large or confluent infarction. No mass effect or hemorrhage. Mild chronic small-vessel change affects the deep white matter of the right hemisphere as well as the left. No hydrocephalus or extra-axial collection. No mass lesion. Vascular: Major vessels at the base of the brain show flow. 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 No large confluent  infarction. No sign of hemorrhage or mass effect. Otherwise, the brain has a good appearance for age, with only mild chronic small-vessel ischemic changes of the cerebral hemispheric white matter. Electronically Signed   By: Paulina Fusi M.D.   On: 12/21/2021 10:09   ECHOCARDIOGRAM COMPLETE  Result Date: 12/21/2021    ECHOCARDIOGRAM REPORT   Patient Name:   Veronica Small Date of Exam: 12/21/2021 Medical Rec #:  [id number]        Height:       66.0 in Accession #:    [id number]       Weight:       177.0 lb Date of Birth:  Nov 20, 1951        BSA:          1.899 m Patient Age:    70 years         BP:           156/84 mmHg Patient Gender: F                HR:           75 bpm. Exam Location:  Inpatient Procedure: 2D Echo, Cardiac Doppler and Color Doppler Indications:    Stroke  History:        Patient has no prior history of Echocardiogram examinations.                 Risk Factors:Former Smoker.  Sonographer:    Vanetta Shawl Referring Phys: [id number] Delila Pereyra A KYLE IMPRESSIONS  1. Left ventricular ejection fraction, by estimation, is 60 to 65%. The left ventricle has normal function. The left ventricle has no regional wall motion abnormalities. Left ventricular diastolic parameters were normal.  2. Right ventricular systolic function is normal. The right ventricular size is normal. Tricuspid regurgitation signal is inadequate for assessing PA pressure.  3. The mitral valve is normal in structure. No evidence of mitral valve regurgitation. No evidence of mitral stenosis.  4. The aortic valve  was not well visualized. Aortic valve regurgitation is not visualized. No aortic stenosis is present. Comparison(s): No prior Echocardiogram. Conclusion(s)/Recommendation(s): No intracardiac source of embolism detected on this transthoracic study. Consider a transesophageal echocardiogram to exclude cardiac source of embolism if clinically indicated. FINDINGS  Left Ventricle: Left ventricular ejection fraction, by estimation,  is 60 to 65%. The left ventricle has normal function. The left ventricle has no regional wall motion abnormalities. The left ventricular internal cavity size was normal in size. There is  no left ventricular hypertrophy. Left ventricular diastolic parameters were normal. Right Ventricle: Calcified papillary muscle. The right ventricular size is normal. No increase in right ventricular wall thickness. Right ventricular systolic function is normal. Tricuspid regurgitation signal is inadequate for assessing PA pressure. Left Atrium: Left atrial size was normal in size. Right Atrium: Right atrial size was normal in size. Pericardium: There is no evidence of pericardial effusion. Mitral Valve: The mitral valve is normal in structure. No evidence of mitral valve regurgitation. No evidence of mitral valve stenosis. Tricuspid Valve: The tricuspid valve is normal in structure. Tricuspid valve regurgitation is not demonstrated. No evidence of tricuspid stenosis. Aortic Valve: The aortic valve was not well visualized. Aortic valve regurgitation is not visualized. No aortic stenosis is present. Aortic valve mean gradient measures 4.5 mmHg. Aortic valve peak gradient measures 8.4 mmHg. Aortic valve area, by VTI measures 2.01 cm. Pulmonic Valve: The pulmonic valve was normal in structure. Pulmonic valve regurgitation is not visualized. No evidence of pulmonic stenosis. Aorta: The aortic root and ascending aorta are structurally normal, with no evidence of dilitation. Venous: The inferior vena cava was not well visualized. IAS/Shunts: No atrial level shunt detected by color flow Doppler.  LEFT VENTRICLE PLAX 2D LVIDd:         4.70 cm   Diastology LVIDs:         3.10 cm   LV e' medial:    9.46 cm/s LV PW:         1.00 cm   LV E/e' medial:  11.7 LV IVS:        1.00 cm   LV e' lateral:   10.60 cm/s LVOT diam:     1.80 cm   LV E/e' lateral: 10.5 LV SV:         66 LV SV Index:   35 LVOT Area:     2.54 cm  RIGHT VENTRICLE RV S prime:      12.30 cm/s LEFT ATRIUM           Index LA diam:      4.30 cm 2.26 cm/m LA Vol (A2C): 37.4 ml 19.70 ml/m  AORTIC VALVE                     PULMONIC VALVE AV Area (Vmax):    2.02 cm      PV Vmax:       1.13 m/s AV Area (Vmean):   2.08 cm      PV Peak grad:  5.1 mmHg AV Area (VTI):     2.01 cm AV Vmax:           145.00 cm/s AV Vmean:          100.400 cm/s AV VTI:            0.328 m AV Peak Grad:      8.4 mmHg AV Mean Grad:      4.5 mmHg LVOT Vmax:  115.00 cm/s LVOT Vmean:        81.900 cm/s LVOT VTI:          0.259 m LVOT/AV VTI ratio: 0.79  AORTA Ao Root diam: 2.90 cm Ao Asc diam:  3.00 cm MITRAL VALVE MV Area (PHT): 5.09 cm     SHUNTS MV Decel Time: 149 msec     Systemic VTI:  0.26 m MV E velocity: 111.00 cm/s  Systemic Diam: 1.80 cm MV A velocity: 123.00 cm/s MV E/A ratio:  0.90 Riley Lam MD Electronically signed by Riley Lam MD Signature Date/Time: 12/21/2021/3:03:59 PM    Final    VAS US CAROTID (at [facility] and [facility] only)  Result Date: 12/23/2021 Carotid Arterial Duplex Study Patient Name:  Veronica Small  Date of Exam:   12/22/2021 Medical Rec #: [id number]         Accession #:    [id number] Date of Birth: [birth year]         Patient Gender: F Patient Age:   14 years Exam Location:  [facility] Procedure:      VAS US CAROTID Referring Phys: Margie Ege --------------------------------------------------------------------------------  Indications:       CVA and Carotid artery disease. Risk Factors:      Hyperlipidemia. Comparison Study:  No prior study Performing Technologist: Gertie Fey MHA, RDMS, RVT, RDCS  Examination Guidelines: A complete evaluation includes B-mode imaging, spectral Doppler, color Doppler, and power Doppler as needed of all accessible portions of each vessel. Bilateral testing is considered an integral part of a complete examination. Limited examinations for reoccurring indications may be performed as noted.  Right Carotid Findings:  +----------+--------+--------+--------+-------------------------+---------+             PSV cm/s EDV cm/s Stenosis Plaque Description        Comments   +----------+--------+--------+--------+-------------------------+---------+  CCA Prox   70       12                                                     +----------+--------+--------+--------+-------------------------+---------+  CCA Distal 66       15                calcific and heterogenous Shadowing  +----------+--------+--------+--------+-------------------------+---------+  ICA Prox   220      71       60-79%   calcific and heterogenous Shadowing  +----------+--------+--------+--------+-------------------------+---------+  ICA Mid    105      26                                                     +----------+--------+--------+--------+-------------------------+---------+  ICA Distal 109      33                                                     +----------+--------+--------+--------+-------------------------+---------+  ECA        453      88       >50%                                          +----------+--------+--------+--------+-------------------------+---------+ +----------+--------+-------+----------------+-------------------+  PSV cm/s EDV cms Describe         Arm Pressure (mmHG)  +----------+--------+-------+----------------+-------------------+  Subclavian 125              Multiphasic, WNL                      +----------+--------+-------+----------------+-------------------+ +---------+--------+--+--------+--+---------+  Vertebral PSV cm/s 59 EDV cm/s 14 Antegrade  +---------+--------+--+--------+--+---------+  Left Carotid Findings: +----------+--------+--------+--------+------------------------------+---------+             PSV cm/s EDV cm/s Stenosis Plaque Description             Comments   +----------+--------+--------+--------+------------------------------+---------+  CCA Prox   93       24                                                           +----------+--------+--------+--------+------------------------------+---------+  CCA Distal 82       21                irregular and calcific         Shadowing  +----------+--------+--------+--------+------------------------------+---------+  ICA Prox   289      74       60-79%   heterogenous, irregular and    Shadowing                                         calcific                                  +----------+--------+--------+--------+------------------------------+---------+  ICA Mid    205      51                                                          +----------+--------+--------+--------+------------------------------+---------+  ICA Distal 90       18                                                          +----------+--------+--------+--------+------------------------------+---------+  ECA        227      23                                                          +----------+--------+--------+--------+------------------------------+---------+ +----------+--------+--------+----------------+-------------------+             PSV cm/s EDV cm/s Describe         Arm Pressure (mmHG)  +----------+--------+--------+----------------+-------------------+  Subclavian 107               Multiphasic, WNL                      +----------+--------+--------+----------------+-------------------+ +---------+--------+--+--------+--+---------+  Vertebral PSV cm/s 93 EDV cm/s 23 Antegrade  +---------+--------+--+--------+--+---------+   Summary: Right Carotid: Velocities in the right ICA are consistent with a 60-79%                stenosis. The ECA appears >50% stenosed. Left Carotid: Velocities in the left ICA are consistent with a 60-79% stenosis. Vertebrals:  Bilateral vertebral arteries demonstrate antegrade flow. Subclavians: Normal flow hemodynamics were seen in bilateral subclavian              arteries. *See table(s) above for measurements and observations.  Electronically signed by Delia Heady MD on  12/23/2021 at 8:17:38 AM.    Final    VAS [place] LOWER EXTREMITY VENOUS (DVT)  Result Date: 12/22/2021  Lower Venous DVT Study Patient Name:  Veronica Small  Date of Exam:   12/22/2021 Medical Rec #: [id number]         Accession #:    [id number] Date of Birth: Jul 14, 1951         Patient Gender: F Patient Age:   22 years Exam Location:  [facility] Procedure:      VAS [place] LOWER EXTREMITY VENOUS (DVT) Referring Phys: Scheryl Marten Niza Soderholm --------------------------------------------------------------------------------  Indications: Stroke.  Comparison Study: No prior study Performing Technologist: Gertie Fey MHA, RDMS, RVT, RDCS  Examination Guidelines: A complete evaluation includes B-mode imaging, spectral Doppler, color Doppler, and power Doppler as needed of all accessible portions of each vessel. Bilateral testing is considered an integral part of a complete examination. Limited examinations for reoccurring indications may be performed as noted. The reflux portion of the exam is performed with the patient in reverse Trendelenburg.  +---------+---------------+---------+-----------+----------+--------------+  RIGHT     Compressibility Phasicity Spontaneity Properties Thrombus Aging  +---------+---------------+---------+-----------+----------+--------------+  CFV       Full            Yes       Yes                                    +---------+---------------+---------+-----------+----------+--------------+  SFJ       Full                                                             +---------+---------------+---------+-----------+----------+--------------+  FV Prox   Full                                                             +---------+---------------+---------+-----------+----------+--------------+  FV Mid    Full                                                             +---------+---------------+---------+-----------+----------+--------------+  FV Distal Full                                                              +---------+---------------+---------+-----------+----------+--------------+  PFV       Full                                                             +---------+---------------+---------+-----------+----------+--------------+  POP       Full            Yes       Yes                                    +---------+---------------+---------+-----------+----------+--------------+  PTV       Full                                                             +---------+---------------+---------+-----------+----------+--------------+  PERO      Full                                                             +---------+---------------+---------+-----------+----------+--------------+   +---------+---------------+---------+-----------+----------+--------------+  LEFT      Compressibility Phasicity Spontaneity Properties Thrombus Aging  +---------+---------------+---------+-----------+----------+--------------+  CFV       Full            Yes       Yes                                    +---------+---------------+---------+-----------+----------+--------------+  SFJ       Full                                                             +---------+---------------+---------+-----------+----------+--------------+  FV Prox   Full                                                             +---------+---------------+---------+-----------+----------+--------------+  FV Mid    Full                                                             +---------+---------------+---------+-----------+----------+--------------+  FV Distal Full                                                             +---------+---------------+---------+-----------+----------+--------------+  PFV       Full                                                             +---------+---------------+---------+-----------+----------+--------------+  POP       Full            Yes       Yes                                     +---------+---------------+---------+-----------+----------+--------------+  PTV       Full                                                             +---------+---------------+---------+-----------+----------+--------------+  PERO      Full                                                             +---------+---------------+---------+-----------+----------+--------------+     Summary: RIGHT: - There is no evidence of deep vein thrombosis in the lower extremity.  - No cystic structure found in the popliteal fossa.  LEFT: - There is no evidence of deep vein thrombosis in the lower extremity.  - No cystic structure found in the popliteal fossa.  *See table(s) above for measurements and observations. Electronically signed by Coral Else MD on 12/22/2021 at 9:16:30 PM.    Final      PHYSICAL EXAM  Temp:  [97.1 F (36.2 C)-99.4 F (37.4 C)] 98 F (36.7 C) (01/26 1600) Pulse Rate:  [58-73] 66 (01/26 1715) Resp:  [10-20] 15 (01/26 1645) BP: (103-161)/(46-99) 105/49 (01/26 1700) SpO2:  [89 %-100 %] 96 % (01/26 1715) Arterial Line BP: (114-162)/(41-55) 139/47 (01/26 1715) Weight:  [80 kg] 80 kg (01/26 1410)  General - Well nourished, well developed, in no apparent distress.  Ophthalmologic - fundi not visualized due to noncooperation.  Cardiovascular - Regular rhythm and rate.  Mental Status -  Level of arousal and orientation to time, place, and person were intact. Language including expression, naming, repetition, comprehension was assessed and found intact. Fund of Knowledge was assessed and was intact.  Cranial Nerves II - XII - II - Visual field intact OU. III, IV, VI - Extraocular movements intact. V - Facial sensation intact bilaterally. VII - Facial movement intact bilaterally. VIII - Hearing & vestibular intact bilaterally. X - Palate elevates symmetrically. XI - Chin turning & shoulder shrug intact bilaterally. XII - Tongue protrusion intact.  Motor Strength - The patients  strength was normal in all extremities and pronator drift was absent except mild left hand dexterity difficulty.  Bulk was normal and fasciculations were absent.   Motor Tone - Muscle tone was assessed at the neck and appendages and was normal.  Reflexes - The patients reflexes were symmetrical in all extremities and she had no  pathological reflexes.  Sensory - Light touch, temperature/pinprick were assessed and were symmetrical except left thumb decreased sensation.    Coordination - The patient had normal movements in the hands and feet with no ataxia or dysmetria however, slow on the left finger-to-nose.  Tremor was absent.  Gait and Station - deferred.   ASSESSMENT/PLAN Ms. Veronica Small is a 71 y.o. female with history of hypertension, hyperlipidemia, anxiety admitted for left upper extremity weakness and numbness. No tPA given due to OOW.    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   CTA head and neck x2 showed bilateral proximal ICA flow reducing stenosis.  Right ICA 80 to 90% stenosis and left ICA estimated at over 75%. MRI right MCA scattered infarcts Carotid Doppler bilateral ICA 60 to 79% stenosis LE venous Doppler no DVT  2D Echo EF 60 to 60% LDL 46 HgbA1c 5.9 UDS positive for benzo and amphentermine SCDs for VTE prophylaxis No antithrombotic prior to admission, now on aspirin 81 mg daily and Brilinta (ticagrelor) 90 mg bid and heparin IV. Patient counseled to be compliant with her antithrombotic medications Ongoing aggressive stroke risk factor management Therapy recommendations: Pending Disposition: Pending  Carotid stenosis CTA head and neck x2 showed bilateral proximal ICA flow reducing stenosis.  Right ICA 80 to 90% stenosis and left ICA estimated at over 75%. Carotid Doppler bilateral ICA 60 to 79% stenosis Cerebral angiogram Severe Prox Rt ICA stenosis. Lt ICA prox 50 to 60 % stenosis  associated with a small ulceration S/p right carotid stenting 1/26  Hypertension Stable Avoid low BP BP goal < 140 post procedure  Hyperlipidemia Home meds: Pravastatin 40 LDL 46, goal < 70 Now on pravastatin 40 Continue statin at discharge  Other Stroke Risk Factors   Other Active Problems Anxiety Depression Leukocytosis WBC 13.9->5.0->6.2  Hospital day # 2  This patient is critically ill due to right MCA stroke, bilateral carotid stenosis, status post right ICA stenting and at significant risk of neurological worsening, death form recurrent stroke, hemorrhagic transformation, stent reocclusion. This patient's care requires constant monitoring of vital signs, hemodynamics, respiratory and cardiac monitoring, review of multiple databases, neurological assessment, discussion with family, other specialists and medical decision making of high complexity. I spent 30 minutes of neurocritical care time in the care of this patient.    Marvel Plan, MD PhD Stroke Neurology 12/23/2021 6:03 PM    To contact Stroke Continuity provider, please refer to [email or web address]. After hours, contact General Neurology

## 2021-12-23 NOTE — Progress Notes (Incomplete)
STUDENT FOLLOW-UP NOTES - DISREGARD                CC/HPI:  81 YOF who was scratched by her cat a week PTA and her forearm became warm to the touch, red and weeping. She went to see her PCP who put her on keflex and she came to the ED with concerns of possible allergic reaction. ED provider noted L sided weakness and slurred speech. She also became septic while in ED and code sepsis was initiated.  CT shows bilateral ICA stenosis (80-90% R, >75% L), now s/p revascularization and R ICA stent 1/26    PMH: HLD, GERD, anxiety, depression  Dx: CVA vs TIA   A: SBPs low, cleviprex stopped Hgb 11.1 >> 8.3, Plts 163 >> 161  -CT negative for GIB -RLE duplex preliminary negative for pseudoaneurysm, DVT, AVF CBC 1/26 0337, 1/27 0521   PLAN:  -f/u duration of abx therapy -replete K PO 40 mEq x 2 (K 3.8 >> 3.1) -trend Hgb and Plts; CT pending to r/o GIB  -heparin VTE ppx? If CBC stable -cont brilinta, asa  Antithromb: SCDs, ASA 81mg , Brilinta 90mg  BID Hgb 11.1 >> 8.3, Plts 203 >> 161  ID: Day 3 of 5-7? WBC 6.2 >> 5.3, Tmax 99.4  Antimicrobials this admission:  Cipro x 1 Bactrim x 1 Unasyn x 1 Vancomycin x 1 Doxycycline 1/24 >>   Microbiology results: 1/24 BCx: NG x2d  1/24 Resp. Panel: negative 1/26 MRSA PCR: negative ***cultures drawn after abx administered   CV: BP goal < 140, LDL 46, EF 60%, Prava 40mg  SBP 100s, Cleviprex off as of 0534  Endocrine: A1c 5.9, CBGs controlled   GI/Nutrition: heart diet, LBM 1/24 x 4  Neuro: Pain 0, RASS 0  Renal: SCr 0.82, UOP 2.6L/24h; UDS +benzo and amphetamine, K 3.1  Pulm: 2L [place]  Heme/Onc:   PTA Med Issues: Omeprazole 20mg  QD, Trazodone 150mg  QHS  Additional Notes:

## 2021-12-24 ENCOUNTER — Encounter (HOSPITAL_COMMUNITY): Payer: Self-pay | Admitting: Interventional Radiology

## 2021-12-24 ENCOUNTER — Inpatient Hospital Stay (HOSPITAL_COMMUNITY): Payer: No Typology Code available for payment source

## 2021-12-24 DIAGNOSIS — I724 Aneurysm of artery of lower extremity: Secondary | ICD-10-CM

## 2021-12-24 DIAGNOSIS — Z7982 Long term (current) use of aspirin: Secondary | ICD-10-CM

## 2021-12-24 DIAGNOSIS — I729 Aneurysm of unspecified site: Secondary | ICD-10-CM

## 2021-12-24 DIAGNOSIS — Z87891 Personal history of nicotine dependence: Secondary | ICD-10-CM

## 2021-12-24 LAB — CBC WITH DIFFERENTIAL/PLATELET
Abs Immature Granulocytes: 0.01 10*3/uL (ref 0.00–0.07)
Basophils Absolute: 0 10*3/uL (ref 0.0–0.1)
Basophils Relative: 0 %
Eosinophils Absolute: 0.1 10*3/uL (ref 0.0–0.5)
Eosinophils Relative: 2 %
HCT: 25.9 % — ABNORMAL LOW (ref 36.0–46.0)
Hemoglobin: 8.3 g/dL — ABNORMAL LOW (ref 12.0–15.0)
Immature Granulocytes: 0 %
Lymphocytes Relative: 17 %
Lymphs Abs: 0.9 10*3/uL (ref 0.7–4.0)
MCH: 27.9 pg (ref 26.0–34.0)
MCHC: 32 g/dL (ref 30.0–36.0)
MCV: 87.2 fL (ref 80.0–100.0)
Monocytes Absolute: 0.4 10*3/uL (ref 0.1–1.0)
Monocytes Relative: 7 %
Neutro Abs: 3.9 10*3/uL (ref 1.7–7.7)
Neutrophils Relative %: 74 %
Platelets: 161 10*3/uL (ref 150–400)
RBC: 2.97 MIL/uL — ABNORMAL LOW (ref 3.87–5.11)
RDW: 12.5 % (ref 11.5–15.5)
WBC: 5.3 10*3/uL (ref 4.0–10.5)
nRBC: 0 % (ref 0.0–0.2)

## 2021-12-24 LAB — BASIC METABOLIC PANEL
Anion gap: 6 (ref 5–15)
BUN: 5 mg/dL — ABNORMAL LOW (ref 8–23)
CO2: 22 mmol/L (ref 22–32)
Calcium: 7.5 mg/dL — ABNORMAL LOW (ref 8.9–10.3)
Chloride: 109 mmol/L (ref 98–111)
Creatinine, Ser: 0.82 mg/dL (ref 0.44–1.00)
GFR, Estimated: >60 mL/min (ref >60)
Glucose, Bld: 129 mg/dL — ABNORMAL HIGH (ref 70–99)
Potassium: 3.1 mmol/L — ABNORMAL LOW (ref 3.5–5.1)
Sodium: 137 mmol/L (ref 135–145)

## 2021-12-24 LAB — TYPE AND SCREEN
ABO/RH(D): A POS
Antibody Screen: NEGATIVE

## 2021-12-24 LAB — HEMOGLOBIN AND HEMATOCRIT, BLOOD
HCT: 28.9 % — ABNORMAL LOW (ref 36.0–46.0)
Hemoglobin: 9.4 g/dL — ABNORMAL LOW (ref 12.0–15.0)

## 2021-12-24 LAB — ABO/RH: ABO/RH(D): A POS

## 2021-12-24 MED ORDER — POTASSIUM CHLORIDE 10 MEQ/100ML IV SOLN
10.0000 meq | INTRAVENOUS | Status: DC
  Filled 2021-12-24 (×4): qty 100

## 2021-12-24 MED ORDER — POTASSIUM CHLORIDE CRYS ER 20 MEQ PO TBCR
40.0000 meq | EXTENDED_RELEASE_TABLET | Freq: Once | ORAL | Status: AC
  Administered 2021-12-24: 40 meq via ORAL
  Filled 2021-12-24: qty 2

## 2021-12-24 MED ORDER — IOHEXOL 350 MG/ML SOLN
100.0000 mL | Freq: Once | INTRAVENOUS | Status: AC | PRN
  Administered 2021-12-24: 100 mL via INTRAVENOUS

## 2021-12-24 NOTE — Progress Notes (Signed)
Hgb 11.1 -> 8.3, Will type and screen. CT abd/pelvis to rule out hematoma. Guaiac stools. Type and screen, repeat H&H in 6 hours.   K3.1, replete  Ritta Slot, MD [facility] [phone]  If 7pm- 7am, please page neurology on call as listed in [facility].

## 2021-12-24 NOTE — Progress Notes (Addendum)
STROKE TEAM PROGRESS NOTE   SUBJECTIVE (INTERVAL HISTORY) No family is at the bedside.  Rt ICA stenting with Dr. Estanislado Pandy yesterday. Pt awake alert, neuro unchanged from yesterday, left thumb numbness improving. Moving all extremities.   OBJECTIVE Temp:  [97.1 F (36.2 C)-98.4 F (36.9 C)] 98.2 F (36.8 C) (01/27 0800) Pulse Rate:  [53-79] 61 (01/27 1200) Cardiac Rhythm: Normal sinus rhythm (01/27 0800) Resp:  [13-24] 17 (01/27 1200) BP: (97-126)/(45-101) 125/60 (01/27 1200) SpO2:  [89 %-99 %] 94 % (01/27 1200) Arterial Line BP: (106-164)/(37-63) 140/63 (01/27 1100) Weight:  [80 kg] 80 kg (01/26 1410)  Recent Labs  Lab 12/21/21 0735  GLUCAP 133*    Recent Labs  Lab 12/21/21 0050 12/22/21 0345 12/23/21 0337 12/24/21 0521  NA 137 137 136 137  K 3.8 3.5 3.8 3.1*  CL 107 108 109 109  CO2 21* 20* 22 22  GLUCOSE 175* 111* 106* 129*  BUN 14 8 8  5*  CREATININE 1.11* 0.91 0.94 0.82  CALCIUM 8.0* 8.0* 8.0* 7.5*    Recent Labs  Lab 12/21/21 0050 12/22/21 0345  AST 27 21  ALT 18 17  ALKPHOS 77 57  BILITOT 0.5 0.5  PROT 6.2* 5.1*  ALBUMIN 3.4* 2.8*    Recent Labs  Lab 12/21/21 0050 12/22/21 0526 12/23/21 0337 12/24/21 0521 12/24/21 1126  WBC 13.9* 5.0 6.2 5.3  --   NEUTROABS 12.6*  --  4.4 3.9  --   HGB 11.6* 10.7* 11.1* 8.3* 9.4*  HCT 36.8 32.8* 33.8* 25.9* 28.9*  MCV 87.2 85.6 84.1 87.2  --   PLT 210 173 203 161  --     No results for input(s): CKTOTAL, CKMB, CKMBINDEX, TROPONINI in the last 168 hours. Recent Labs    12/22/21 0345 12/23/21 0337  LABPROT 13.9 13.2  INR 1.1 1.0    No results for input(s): COLORURINE, LABSPEC, PHURINE, GLUCOSEU, HGBUR, BILIRUBINUR, KETONESUR, PROTEINUR, UROBILINOGEN, NITRITE, LEUKOCYTESUR in the last 72 hours.  Invalid input(s): APPERANCEUR      Component Value Date/Time   CHOL 105 12/22/2021 0345   TRIG 63 12/22/2021 0345   HDL 46 12/22/2021 0345   CHOLHDL 2.3 12/22/2021 0345   VLDL 13 12/22/2021 0345    LDLCALC 46 12/22/2021 0345   Lab Results  Component Value Date   HGBA1C 5.9 (H) 12/22/2021      Component Value Date/Time   LABOPIA NONE DETECTED 12/21/2021 0200   COCAINSCRNUR NONE DETECTED 12/21/2021 0200   LABBENZ POSITIVE (A) 12/21/2021 0200   AMPHETMU POSITIVE (A) 12/21/2021 0200   THCU NONE DETECTED 12/21/2021 0200   LABBARB NONE DETECTED 12/21/2021 0200    Recent Labs  Lab 12/21/21 0050  ETH <10     I have personally reviewed the radiological images below and agree with the radiology interpretations.  CT Angio Head W or Wo Contrast  Result Date: 12/21/2021 CLINICAL DATA:  Stroke follow-up.  Near syncope with slurred speech EXAM: CT ANGIOGRAPHY HEAD AND NECK TECHNIQUE: Multidetector CT imaging of the head and neck was performed using the standard protocol during bolus administration of intravenous contrast. Multiplanar CT image reconstructions and MIPs were obtained to evaluate the vascular anatomy. Carotid stenosis measurements (when applicable) are obtained utilizing NASCET criteria, using the distal internal carotid diameter as the denominator. RADIATION DOSE REDUCTION: This exam was performed according to the departmental dose-optimization program which includes automated exposure control, adjustment of the mA and/or kV according to patient size and/or use of iterative reconstruction technique. CONTRAST:  72mL OMNIPAQUE  IOHEXOL 350 MG/ML SOLN COMPARISON:  Head CT from earlier today FINDINGS: CTA NECK FINDINGS Aortic arch: Atheromatous calcification with 3 vessel branching. Right carotid system: Atheromatous plaque, heavily calcified, at the bifurcation/proximal ICA but the lumen cannot be measured/assessed given the degree of laryngeal motion. Flow reducing stenosis is definitely possible. No downstream stenosis or beading. Left carotid system: Atheromatous plaque especially heavy calcification at the bifurcation and proximal ICA. Due to laryngeal artifact the lumen cannot be  assessed/measured. Possible flow limiting stenosis. No evidence of dissection. Vertebral arteries: Proximal left subclavian atheromatous plaque. No flow limiting stenosis. Dominant left vertebral artery. Both vertebral arteries are smoothly contoured and widely patent to the dura. Skeleton: Cervical spine degeneration especially affecting left-sided facets. Other neck: No acute finding.  Motion artifact Upper chest: Clear apical lungs. Review of the MIP images confirms the above findings CTA HEAD FINDINGS Anterior circulation: Atheromatous calcification of the carotid siphons. No branch occlusion, beading, or aneurysm. Posterior circulation: Vertebral and basilar arteries are smoothly contoured and widely patent. Left vertebral artery is dominant right vertebral artery ending in PICA. No branch occlusion, beading, or aneurysm. Venous sinuses: Diffusely patent Anatomic variants: None significant Review of the MIP images confirms the above findings IMPRESSION: 1. No emergent finding. No evidence of vertebrobasilar insufficiency. 2. Most notable atheromatous plaque is at the bilateral carotid bifurcation. Nondiagnostic assessment of associated stenoses given patient motion, flow reducing stenosis could be present on either side and follow-up for carotid Doppler or repeat CTA recommended. Electronically Signed   By: Jorje Guild M.D.   On: 12/21/2021 06:11   CT ANGIO HEAD NECK W WO CM  Result Date: 12/22/2021 CLINICAL DATA:  Stroke workup EXAM: CT ANGIOGRAPHY HEAD AND NECK TECHNIQUE: Multidetector CT imaging of the head and neck was performed using the standard protocol during bolus administration of intravenous contrast. Multiplanar CT image reconstructions and MIPs were obtained to evaluate the vascular anatomy. Carotid stenosis measurements (when applicable) are obtained utilizing NASCET criteria, using the distal internal carotid diameter as the denominator. RADIATION DOSE REDUCTION: This exam was performed  according to the departmental dose-optimization program which includes automated exposure control, adjustment of the mA and/or kV according to patient size and/or use of iterative reconstruction technique. CONTRAST:  68mL OMNIPAQUE IOHEXOL 350 MG/ML SOLN COMPARISON:  Brain MRI from yesterday FINDINGS: CT HEAD FINDINGS Brain: Known small acute infarcts along the cortex at the high right cerebrum. No hemorrhage, hydrocephalus, or gross progression. Vascular: See below Skull: No acute finding Sinuses: Small presumed retention cyst in the left maxillary sinus. Orbits: Unremarkable Review of the MIP images confirms the above findings CTA NECK FINDINGS Aortic arch: Atheromatous calcifications. Three vessel branching. No acute finding. Right carotid system: Motion artifact again complicates evaluation of the carotid bifurcations. Still, there is better visualization of the bifurcation where there is mixed density plaque with proximal ICA luminal narrowing to less than 1 mm, 80-90% stenosis based on sagittal reformats. Bifurcation is below the right mandibular angle. On axial slices there is a seemingly luminal low-density projection suggesting thrombus, although less apparent on sagittal images. Left carotid system: Motion artifact limits evaluation of the bifurcation. There is mixed density, heavily calcified plaque the bifurcation with proximal ICA stenosis estimated at over 75%. No ulceration or dissection seen. Vertebral arteries: Proximal subclavian atherosclerosis without flow limiting stenosis. The left vertebral artery is dominant. Skeleton: No emergent finding. Asymmetric left degenerative facet spurring Other neck: No acute finding Upper chest: No acute finding Review of the MIP images confirms the  above findings CTA HEAD FINDINGS Anterior circulation: Atheromatous plaque on the carotid siphons. No major branch occlusion, flow limiting stenosis, aneurysm, or vascular malformation. Posterior circulation: Left  dominant vertebral artery. Right vertebral artery ends in PICA. No branch occlusion, beading, or aneurysm. Venous sinuses: Patent Anatomic variants: None significant Review of the MIP images confirms the above findings IMPRESSION: Exam is again complicated by patient motion degrading visualization of the carotid bifurcations, but improved from before and showing definite flow reducing stenosis at both proximal ICA. Worse disease on the right where proximal ICA narrowing measures 80-90% and there may be some luminal clot. 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 No gross progression and no interval hemorrhage. Electronically Signed   By: Jorje Guild M.D.   On: 12/22/2021 05:25   CT HEAD WO CONTRAST  Result Date: 12/21/2021 CLINICAL DATA:  Transient ischemic attack. EXAM: CT HEAD WITHOUT CONTRAST TECHNIQUE: Contiguous axial images were obtained from the base of the skull through the vertex without intravenous contrast. RADIATION DOSE REDUCTION: This exam was performed according to the departmental dose-optimization program which includes automated exposure control, adjustment of the mA and/or kV according to patient size and/or use of iterative reconstruction technique. COMPARISON:  Head CT dated 01/02/2009. FINDINGS: Brain: Mild age-related atrophy and chronic microvascular ischemic changes. There is no acute intracranial hemorrhage. No mass effect or midline shift. No extra-axial fluid collection. Vascular: No hyperdense vessel or unexpected calcification. Skull: Normal. Negative for fracture or focal lesion. Sinuses/Orbits: There is mucoperiosteal thickening of paranasal sinuses. No air-fluid level. The mastoid air cells are clear. Other: None IMPRESSION: 1. No acute intracranial pathology. 2. Mild age-related atrophy and chronic microvascular ischemic changes. Electronically Signed   By: Anner Crete M.D.   On: 12/21/2021 01:35   CT Angio Neck W and/or Wo Contrast  Result Date:  12/21/2021 CLINICAL DATA:  Stroke follow-up.  Near syncope with slurred speech EXAM: CT ANGIOGRAPHY HEAD AND NECK TECHNIQUE: Multidetector CT imaging of the head and neck was performed using the standard protocol during bolus administration of intravenous contrast. Multiplanar CT image reconstructions and MIPs were obtained to evaluate the vascular anatomy. Carotid stenosis measurements (when applicable) are obtained utilizing NASCET criteria, using the distal internal carotid diameter as the denominator. RADIATION DOSE REDUCTION: This exam was performed according to the departmental dose-optimization program which includes automated exposure control, adjustment of the mA and/or kV according to patient size and/or use of iterative reconstruction technique. CONTRAST:  86mL OMNIPAQUE IOHEXOL 350 MG/ML SOLN COMPARISON:  Head CT from earlier today FINDINGS: CTA NECK FINDINGS Aortic arch: Atheromatous calcification with 3 vessel branching. Right carotid system: Atheromatous plaque, heavily calcified, at the bifurcation/proximal ICA but the lumen cannot be measured/assessed given the degree of laryngeal motion. Flow reducing stenosis is definitely possible. No downstream stenosis or beading. Left carotid system: Atheromatous plaque especially heavy calcification at the bifurcation and proximal ICA. Due to laryngeal artifact the lumen cannot be assessed/measured. Possible flow limiting stenosis. No evidence of dissection. Vertebral arteries: Proximal left subclavian atheromatous plaque. No flow limiting stenosis. Dominant left vertebral artery. Both vertebral arteries are smoothly contoured and widely patent to the dura. Skeleton: Cervical spine degeneration especially affecting left-sided facets. Other neck: No acute finding.  Motion artifact Upper chest: Clear apical lungs. Review of the MIP images confirms the above findings CTA HEAD FINDINGS Anterior circulation: Atheromatous calcification of the carotid siphons. No  branch occlusion, beading, or aneurysm. Posterior circulation: Vertebral and basilar arteries are smoothly contoured and widely patent.  Left vertebral artery is dominant right vertebral artery ending in PICA. No branch occlusion, beading, or aneurysm. Venous sinuses: Diffusely patent Anatomic variants: None significant Review of the MIP images confirms the above findings IMPRESSION: 1. No emergent finding. No evidence of vertebrobasilar insufficiency. 2. Most notable atheromatous plaque is at the bilateral carotid bifurcation. Nondiagnostic assessment of associated stenoses given patient motion, flow reducing stenosis could be present on either side and follow-up for carotid Doppler or repeat CTA recommended. Electronically Signed   By: Jorje Guild M.D.   On: 12/21/2021 06:11   MR BRAIN WO CONTRAST  Result Date: 12/21/2021 CLINICAL DATA:  Neuro deficit, acute, stroke suspected. Near syncopal episode. Tremors. Slurred speech. Facial droop. EXAM: MRI HEAD WITHOUT CONTRAST TECHNIQUE: Multiplanar, multiecho pulse sequences of the brain and surrounding structures were obtained without intravenous contrast. COMPARISON:  CT studies earlier same day.  MRI 01/03/2009. FINDINGS: Brain: No abnormality affects the brainstem or cerebellum. Left cerebral hemisphere shows minimal chronic small-vessel change of the white matter. Right hemisphere shows numerous acute infarctions affecting the cortical brain in the right middle cerebral artery territory consistent with micro embolic infarctions. No large or confluent infarction. No mass effect or hemorrhage. Mild chronic small-vessel change affects the deep white matter of the right hemisphere as well as the left. No hydrocephalus or extra-axial collection. No mass lesion. Vascular: Major vessels at the base of the brain show flow. Skull and upper cervical spine: Negative Sinuses/Orbits: Clear/normal Other: None IMPRESSION: Numerous small acute micro embolic infarctions in  the right middle cerebral artery territory. No large confluent infarction. No sign of hemorrhage or mass effect. Otherwise, the brain has a good appearance for age, with only mild chronic small-vessel ischemic changes of the cerebral hemispheric white matter. Electronically Signed   By: Nelson Chimes M.D.   On: 12/21/2021 10:09   VAS [place] GROIN PSEUDOANEURYSM  Result Date: 12/24/2021  ARTERIAL PSEUDOANEURYSM  Patient Name:  Veronica Small  Date of Exam:   12/24/2021 Medical Rec #: [id number]         Accession #:    [id number] Date of Birth: Jun 28, 1951         Patient Gender: F Patient Age:   71 years Exam Location:  [facility] Procedure:      VAS [place] GROIN PSEUDOANEURYSM Referring Phys: Rosalin Hawking --------------------------------------------------------------------------------  Exam: Right groin Indications: Patient complains of swelling post procedure. History: S/p catheterization. Comparison Study: No previous exams Performing Technologist: Jody Hill RVT, RDMS  Examination Guidelines: A complete evaluation includes B-mode imaging, spectral Doppler, color Doppler, and power Doppler as needed of all accessible portions of each vessel. Bilateral testing is considered an integral part of a complete examination. Limited examinations for reoccurring indications may be performed as noted. +------------+----------+----------+------+----------+  Right Duplex PSV (cm/s)  Waveform  Plaque Comment(s)  +------------+----------+----------+------+----------+  CFA             155      biphasic                     +------------+----------+----------+------+----------+  PFA              99     monophasic                    +------------+----------+----------+------+----------+  Prox SFA        155      biphasic                     +------------+----------+----------+------+----------+  Right Vein comments:CFV patent by color/doppler  Summary: No evidence of pseudoaneurysm, AVF or DVT     --------------------------------------------------------------------------------    Preliminary    ECHOCARDIOGRAM COMPLETE  Result Date: 12/21/2021    ECHOCARDIOGRAM REPORT   Patient Name:   Veronica Small Date of Exam: 12/21/2021 Medical Rec #:  [id number]        Height:       66.0 in Accession #:    [id number]       Weight:       177.0 lb Date of Birth:  [birth year]        BSA:          1.899 m Patient Age:    29 years         BP:           156/84 mmHg Patient Gender: F                HR:           75 bpm. Exam Location:  Inpatient Procedure: 2D Echo, Cardiac Doppler and Color Doppler Indications:    Stroke  History:        Patient has no prior history of Echocardiogram examinations.                 Risk Factors:Former Smoker.  Sonographer:    Glo Herring Referring Phys: [id number] [place]  1. Left ventricular ejection fraction, by estimation, is 60 to 65%. The left ventricle has normal function. The left ventricle has no regional wall motion abnormalities. Left ventricular diastolic parameters were normal.  2. Right ventricular systolic function is normal. The right ventricular size is normal. Tricuspid regurgitation signal is inadequate for assessing PA pressure.  3. The mitral valve is normal in structure. No evidence of mitral valve regurgitation. No evidence of mitral stenosis.  4. The aortic valve was not well visualized. Aortic valve regurgitation is not visualized. No aortic stenosis is present. Comparison(s): No prior Echocardiogram. Conclusion(s)/Recommendation(s): No intracardiac source of embolism detected on this transthoracic study. Consider a transesophageal echocardiogram to exclude cardiac source of embolism if clinically indicated. FINDINGS  Left Ventricle: Left ventricular ejection fraction, by estimation, is 60 to 65%. The left ventricle has normal function. The left ventricle has no regional wall motion abnormalities. The left ventricular internal cavity size was normal  in size. There is  no left ventricular hypertrophy. Left ventricular diastolic parameters were normal. Right Ventricle: Calcified papillary muscle. The right ventricular size is normal. No increase in right ventricular wall thickness. Right ventricular systolic function is normal. Tricuspid regurgitation signal is inadequate for assessing PA pressure. Left Atrium: Left atrial size was normal in size. Right Atrium: Right atrial size was normal in size. Pericardium: There is no evidence of pericardial effusion. Mitral Valve: The mitral valve is normal in structure. No evidence of mitral valve regurgitation. No evidence of mitral valve stenosis. Tricuspid Valve: The tricuspid valve is normal in structure. Tricuspid valve regurgitation is not demonstrated. No evidence of tricuspid stenosis. Aortic Valve: The aortic valve was not well visualized. Aortic valve regurgitation is not visualized. No aortic stenosis is present. Aortic valve mean gradient measures 4.5 mmHg. Aortic valve peak gradient measures 8.4 mmHg. Aortic valve area, by VTI measures 2.01 cm. Pulmonic Valve: The pulmonic valve was normal in structure. Pulmonic valve regurgitation is not visualized. No evidence of pulmonic stenosis. Aorta: The aortic root and ascending aorta are structurally normal, with no evidence of dilitation. Venous: The inferior  vena cava was not well visualized. IAS/Shunts: No atrial level shunt detected by color flow Doppler.  LEFT VENTRICLE PLAX 2D LVIDd:         4.70 cm   Diastology LVIDs:         3.10 cm   LV e' medial:    9.46 cm/s LV PW:         1.00 cm   LV E/e' medial:  11.7 LV IVS:        1.00 cm   LV e' lateral:   10.60 cm/s LVOT diam:     1.80 cm   LV E/e' lateral: 10.5 LV SV:         66 LV SV Index:   35 LVOT Area:     2.54 cm  RIGHT VENTRICLE RV S prime:     12.30 cm/s LEFT ATRIUM           Index LA diam:      4.30 cm 2.26 cm/m LA Vol (A2C): 37.4 ml 19.70 ml/m  AORTIC VALVE                     PULMONIC VALVE AV Area  (Vmax):    2.02 cm      PV Vmax:       1.13 m/s AV Area (Vmean):   2.08 cm      PV Peak grad:  5.1 mmHg AV Area (VTI):     2.01 cm AV Vmax:           145.00 cm/s AV Vmean:          100.400 cm/s AV VTI:            0.328 m AV Peak Grad:      8.4 mmHg AV Mean Grad:      4.5 mmHg LVOT Vmax:         115.00 cm/s LVOT Vmean:        81.900 cm/s LVOT VTI:          0.259 m LVOT/AV VTI ratio: 0.79  AORTA Ao Root diam: 2.90 cm Ao Asc diam:  3.00 cm MITRAL VALVE MV Area (PHT): 5.09 cm     SHUNTS MV Decel Time: 149 msec     Systemic VTI:  0.26 m MV E velocity: 111.00 cm/s  Systemic Diam: 1.80 cm MV A velocity: 123.00 cm/s MV E/A ratio:  0.90 Rudean Haskell MD Electronically signed by Rudean Haskell MD Signature Date/Time: 12/21/2021/3:03:59 PM    Final    VAS US CAROTID (at [facility] and [facility] only)  Result Date: 12/23/2021 Carotid Arterial Duplex Study Patient Name:  Veronica Small  Date of Exam:   12/22/2021 Medical Rec #: [id number]         Accession #:    [id number] Date of Birth: 1951/08/20         Patient Gender: F Patient Age:   59 years Exam Location:  [facility] Procedure:      VAS US CAROTID Referring Phys: Cherylann Ratel --------------------------------------------------------------------------------  Indications:       CVA and Carotid artery disease. Risk Factors:      Hyperlipidemia. Comparison Study:  No prior study Performing Technologist: Maudry Mayhew MHA, RDMS, RVT, RDCS  Examination Guidelines: A complete evaluation includes B-mode imaging, spectral Doppler, color Doppler, and power Doppler as needed of all accessible portions of each vessel. Bilateral testing is considered an integral part of a complete examination. Limited examinations for reoccurring indications may be  performed as noted.  Right Carotid Findings: +----------+--------+--------+--------+-------------------------+---------+             PSV cm/s EDV cm/s Stenosis Plaque Description        Comments    +----------+--------+--------+--------+-------------------------+---------+  CCA Prox   70       12                                                     +----------+--------+--------+--------+-------------------------+---------+  CCA Distal 66       15                calcific and heterogenous Shadowing  +----------+--------+--------+--------+-------------------------+---------+  ICA Prox   220      71       60-79%   calcific and heterogenous Shadowing  +----------+--------+--------+--------+-------------------------+---------+  ICA Mid    105      26                                                     +----------+--------+--------+--------+-------------------------+---------+  ICA Distal 109      33                                                     +----------+--------+--------+--------+-------------------------+---------+  ECA        453      88       >50%                                          +----------+--------+--------+--------+-------------------------+---------+ +----------+--------+-------+----------------+-------------------+             PSV cm/s EDV cms Describe         Arm Pressure (mmHG)  +----------+--------+-------+----------------+-------------------+  Subclavian 125              Multiphasic, WNL                      +----------+--------+-------+----------------+-------------------+ +---------+--------+--+--------+--+---------+  Vertebral PSV cm/s 59 EDV cm/s 14 Antegrade  +---------+--------+--+--------+--+---------+  Left Carotid Findings: +----------+--------+--------+--------+------------------------------+---------+             PSV cm/s EDV cm/s Stenosis Plaque Description             Comments   +----------+--------+--------+--------+------------------------------+---------+  CCA Prox   93       24                                                          +----------+--------+--------+--------+------------------------------+---------+  CCA Distal 82       21                irregular and  calcific         Shadowing  +----------+--------+--------+--------+------------------------------+---------+  ICA Prox   289  74       60-79%   heterogenous, irregular and    Shadowing                                         calcific                                  +----------+--------+--------+--------+------------------------------+---------+  ICA Mid    205      51                                                          +----------+--------+--------+--------+------------------------------+---------+  ICA Distal 90       18                                                          +----------+--------+--------+--------+------------------------------+---------+  ECA        227      23                                                          +----------+--------+--------+--------+------------------------------+---------+ +----------+--------+--------+----------------+-------------------+             PSV cm/s EDV cm/s Describe         Arm Pressure (mmHG)  +----------+--------+--------+----------------+-------------------+  Subclavian 107               Multiphasic, WNL                      +----------+--------+--------+----------------+-------------------+ +---------+--------+--+--------+--+---------+  Vertebral PSV cm/s 93 EDV cm/s 23 Antegrade  +---------+--------+--+--------+--+---------+   Summary: Right Carotid: Velocities in the right ICA are consistent with a 60-79%                stenosis. The ECA appears >50% stenosed. Left Carotid: Velocities in the left ICA are consistent with a 60-79% stenosis. Vertebrals:  Bilateral vertebral arteries demonstrate antegrade flow. Subclavians: Normal flow hemodynamics were seen in bilateral subclavian              arteries. *See table(s) above for measurements and observations.  Electronically signed by Antony Contras MD on 12/23/2021 at 8:17:38 AM.    Final    VAS [place] LOWER EXTREMITY VENOUS (DVT)  Result Date: 12/22/2021  Lower Venous DVT Study Patient Name:  JEMIA ZOTTER  Date of Exam:   12/22/2021 Medical Rec #: [id number]         Accession #:    [id number] Date of Birth: 1951-04-26         Patient Gender: F Patient Age:   89 years Exam Location:  [facility] Procedure:      VAS [place] LOWER EXTREMITY VENOUS (DVT) Referring Phys: Cornelius Moras Loras Grieshop --------------------------------------------------------------------------------  Indications: Stroke.  Comparison Study: No prior study Performing Technologist: Maudry Mayhew  MHA, RDMS, RVT, RDCS  Examination Guidelines: A complete evaluation includes B-mode imaging, spectral Doppler, color Doppler, and power Doppler as needed of all accessible portions of each vessel. Bilateral testing is considered an integral part of a complete examination. Limited examinations for reoccurring indications may be performed as noted. The reflux portion of the exam is performed with the patient in reverse Trendelenburg.  +---------+---------------+---------+-----------+----------+--------------+  RIGHT     Compressibility Phasicity Spontaneity Properties Thrombus Aging  +---------+---------------+---------+-----------+----------+--------------+  CFV       Full            Yes       Yes                                    +---------+---------------+---------+-----------+----------+--------------+  SFJ       Full                                                             +---------+---------------+---------+-----------+----------+--------------+  FV Prox   Full                                                             +---------+---------------+---------+-----------+----------+--------------+  FV Mid    Full                                                             +---------+---------------+---------+-----------+----------+--------------+  FV Distal Full                                                             +---------+---------------+---------+-----------+----------+--------------+  PFV       Full                                                              +---------+---------------+---------+-----------+----------+--------------+  POP       Full            Yes       Yes                                    +---------+---------------+---------+-----------+----------+--------------+  PTV       Full                                                             +---------+---------------+---------+-----------+----------+--------------+  PERO      Full                                                             +---------+---------------+---------+-----------+----------+--------------+   +---------+---------------+---------+-----------+----------+--------------+  LEFT      Compressibility Phasicity Spontaneity Properties Thrombus Aging  +---------+---------------+---------+-----------+----------+--------------+  CFV       Full            Yes       Yes                                    +---------+---------------+---------+-----------+----------+--------------+  SFJ       Full                                                             +---------+---------------+---------+-----------+----------+--------------+  FV Prox   Full                                                             +---------+---------------+---------+-----------+----------+--------------+  FV Mid    Full                                                             +---------+---------------+---------+-----------+----------+--------------+  FV Distal Full                                                             +---------+---------------+---------+-----------+----------+--------------+  PFV       Full                                                             +---------+---------------+---------+-----------+----------+--------------+  POP       Full            Yes       Yes                                    +---------+---------------+---------+-----------+----------+--------------+  PTV       Full                                                              +---------+---------------+---------+-----------+----------+--------------+  PERO      Full                                                             +---------+---------------+---------+-----------+----------+--------------+     Summary: RIGHT: - There is no evidence of deep vein thrombosis in the lower extremity.  - No cystic structure found in the popliteal fossa.  LEFT: - There is no evidence of deep vein thrombosis in the lower extremity.  - No cystic structure found in the popliteal fossa.  *See table(s) above for measurements and observations. Electronically signed by Harold Barban MD on 12/22/2021 at 9:16:30 PM.    Final    CT ANGIO GI BLEED  Result Date: 12/24/2021 CLINICAL DATA:  Concern for retroperitoneal bleed/hematoma and GI bleed. EXAM: CTA ABDOMEN AND PELVIS WITHOUT AND WITH CONTRAST TECHNIQUE: Multidetector CT imaging of the abdomen and pelvis was performed using the standard protocol during bolus administration of intravenous contrast. Multiplanar reconstructed images and MIPs were obtained and reviewed to evaluate the vascular anatomy. RADIATION DOSE REDUCTION: This exam was performed according to the departmental dose-optimization program which includes automated exposure control, adjustment of the mA and/or kV according to patient size and/or use of iterative reconstruction technique. CONTRAST:  173mL OMNIPAQUE IOHEXOL 350 MG/ML SOLN COMPARISON:  Abdominal ultrasound March 202012 FINDINGS: VASCULAR Aorta: Aortic atherosclerosis without aneurysmal dilation, dissection or significant stenosis. Celiac and SMA: Arise by a common trunk from the aorta. Both are patent without evidence of aneurysm, dissection, vasculitis or significant stenosis. Renals: Both renal arteries are patent without evidence of aneurysm, dissection, vasculitis, fibromuscular dysplasia or significant stenosis. IMA: Patent without evidence of aneurysm, dissection, vasculitis or significant stenosis. Inflow: Patent without  evidence of aneurysm, dissection, vasculitis or significant stenosis. Proximal Outflow: Bilateral common femoral and visualized portions of the superficial and profunda femoral arteries are patent. Nodular linear band of contrast extends from the anterior aspect of the common femoral artery on image 201/8 with heterogeneous fluid tracking from the distal aspect of the external iliac along the common femoral and superficial femoral arteries with the distal aspect excluded from the field of view. Veins: No obvious venous abnormality within the limitations of this arterial phase study. Review of the MIP images confirms the above findings. NON-VASCULAR Lower chest: Mild diffuse bronchial wall thickening in the lung bases. Combination of scarring and atelectasis in the lung bases. Hepatobiliary: No suspicious hepatic lesion. Vicarious secretion of contrast in the gallbladder. No evidence of acute gallbladder inflammation. No biliary ductal dilation. Pancreas: No pancreatic ductal dilation or evidence of acute inflammation. Spleen: Normal in size without focal abnormality. Adrenals/Urinary Tract: Bilateral adrenal glands appear normal. No hydronephrosis. Kidneys demonstrate symmetric enhancement and excretion of contrast material. No solid enhancing renal mass. Urinary bladder is decompressed around a Foley catheter. Stomach/Bowel: No enteric contrast was administered. Small hiatal hernia. Stomach is unremarkable for degree of distension. No pathologic dilation of small or large bowel. Terminal ileum appears normal. The appendix is not confidently identified however there is no pericecal inflammation. No evidence of acute bowel inflammation. No findings to indicate active gastrointestinal hemorrhage. Lymphatic: No pathologically enlarged abdominal or pelvic lymph nodes. Reproductive: Uterus and bilateral adnexa are unremarkable. Other: Stranding in the right groin with vascular/perivascular findings described above.  Musculoskeletal: No acute or significant osseous findings. IMPRESSION:  VASCULAR 1. Right groin stranding with a nodular linear band of contrast extravasation from the anterior aspect of the right common femoral artery and heterogeneous fluid tracking from the distal aspect of the right external iliac along the common femoral and superficial femoral arteries with the distal aspect excluded from the field of view. Findings consistent with active extravasation likely related to prior vascular access with a probable small pseudoaneurysm. No retroperitoneal hemorrhage/hematoma. NON-VASCULAR 1. No evidence of active gastrointestinal bleed. 2. Mild diffuse bronchial wall thickening in the lung bases with associated scarring and atelectasis. These results will be called to the ordering clinician or representative by the Radiologist Assistant, and communication documented in the PACS or [employer]. Electronically Signed   By: Dahlia Bailiff M.D.   On: 12/24/2021 07:39     PHYSICAL EXAM  Temp:  [97.1 F (36.2 C)-98.4 F (36.9 C)] 98.2 F (36.8 C) (01/27 0800) Pulse Rate:  [53-79] 61 (01/27 1200) Resp:  [13-24] 17 (01/27 1200) BP: (97-126)/(45-101) 125/60 (01/27 1200) SpO2:  [89 %-99 %] 94 % (01/27 1200) Arterial Line BP: (106-164)/(37-63) 140/63 (01/27 1100) Weight:  [80 kg] 80 kg (01/26 1410)  General - Well nourished, well developed, in no apparent distress.  Ophthalmologic - fundi not visualized due to noncooperation.  Cardiovascular - Regular rhythm and rate.  Mental Status -  Level of arousal and orientation to time, place, and person were intact. Language including expression, naming, repetition, comprehension was assessed and found intact. Fund of Knowledge was assessed and was intact.  Cranial Nerves II - XII - II - Visual field intact OU. III, IV, VI - Extraocular movements intact. V - Facial sensation intact bilaterally. VII - Facial movement intact bilaterally. VIII - Hearing &  vestibular intact bilaterally. X - Palate elevates symmetrically. XI - Chin turning & shoulder shrug intact bilaterally. XII - Tongue protrusion intact.  Motor Strength - The patients strength was normal in all extremities and pronator drift was absent except mild left hand dexterity difficulty.  Bulk was normal and fasciculations were absent.   Motor Tone - Muscle tone was assessed at the neck and appendages and was normal.  Reflexes - The patients reflexes were symmetrical in all extremities and she had no pathological reflexes.  Sensory - Light touch, temperature/pinprick were assessed and were symmetrical except left thumb decreased sensation. Sensation is improving.     Coordination - The patient had normal movements in the hands and feet with no ataxia or dysmetria however, slow on the left finger-to-nose.  Tremor was absent.  Gait and Station - deferred.   ASSESSMENT/PLAN Ms. Aviah Buntain is a 71 y.o. female with history of hypertension, hyperlipidemia, anxiety admitted for left upper extremity weakness and numbness. No tPA given due to OOW.  Right carotid stenting on 1/26. Decrease in hemoglobin overnight, concern for pseudoaneurysm. VVS consulted by IR for management.    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   CTA head and neck x2 showed bilateral proximal ICA flow reducing stenosis.  Right ICA 80 to 90% stenosis and left ICA estimated at over 75%. MRI right MCA scattered infarcts Carotid Doppler bilateral ICA 60 to 79% stenosis LE venous Doppler no DVT 2D Echo EF 60 to 60% LDL 46 HgbA1c 5.9 UDS positive for benzo and amphentermine SCDs for VTE prophylaxis No antithrombotic prior to admission, now on aspirin 81 mg daily and Brilinta (ticagrelor) 90 mg bid. Off heparin now. Patient counseled  to be compliant with her antithrombotic medications Ongoing aggressive stroke risk factor  management Therapy recommendations: Pending Disposition: Pending  Carotid stenosis CTA head and neck x2 showed bilateral proximal ICA flow reducing stenosis.  Right ICA 80 to 90% stenosis and left ICA estimated at over 75%. Carotid Doppler bilateral ICA 60 to 79% stenosis Cerebral angiogram Severe Prox Rt ICA stenosis. Lt ICA prox 50 to 60 % stenosis associated with a small ulceration S/p right carotid stenting 1/26  Right femoral pseudoaneurysm Decreased HGB 11.1-> 8.3-> 9.4 Type and screen completed VVS consult- "extravasation has thrombosed with discontinuation of heparin" Right vein duplex- no evidence of pseudoaneurysm, AVF, or DVT Close monitoring  Hypertension Stable Avoid low BP BP goal normotensive  Hyperlipidemia Home meds: Pravastatin 40 LDL 46, goal < 70 Now on pravastatin 40 Continue statin at discharge  Other Active Problems Anxiety Depression Leukocytosis WBC 13.9->5.0->6.2   Hospital day # 3  ATTENDING NOTE: I reviewed above note and agree with the assessment and plan. Pt was seen and examined.   Pt son is at the bedside. Pt is now OK to sit in bed per Dr. Estanislado Pandy. Overnight pt Hb drop from 11.1 to 8.3, repeat at 9.4. CT abdomen and pelvis showed right groin pseudoaneurysm with femoral artery active extravasation. [place] stat showed no pseudoaneurysm. Vascular surgery Dr. Donzetta Matters consulted and no intervention needed at this time. Will close watch. Pt neuro stable and intact except left thumb numbness since presentation. Will transfer out of ICU. Continue ASA and brilinta and statin. Will follow. Possible d/c tomorrow if stable.   For detailed assessment and plan, please refer to above as I have made changes wherever appropriate.   Rosalin Hawking, MD PhD Stroke Neurology 12/24/2021 1:37 PM  Patient condition worsened within the last 24 hours, has developed anemia and right groin pseudoaneurysm. I discussed with Dr. Estanislado Pandy and Dr Donzetta Matters. Extensive time was spent  in counseling and coordination of care, reviewing test results, images and medication, and discussing the diagnosis, treatment plan and potential prognosis. This patient's care requiresreview of multiple databases, neurological assessment, discussion with family, other specialists and medical decision making of high complexity.      To contact Stroke Continuity provider, please refer to [email or web address] After hours, contact General Neurology

## 2021-12-24 NOTE — Progress Notes (Signed)
PROGRESS NOTE  Veronica Small  DOB: 11-10-1951  PCP: Renford Dills, MD OMV:[id number]  DOA: 12/20/2021  LOS: 3 days  Hospital Day: 5  Chief Complaint  Patient presents with   Medication Reaction   Brief narrative: Veronica Small is a 71 y.o. female with PMH significant for HLD, anxiety/depression, GERD who presented to the ED on 12/20/2021 with left upper extremity weakness, numbness and heaviness. She apparently was scratched on her right forearm by her cat a couple of days ago after which she developed a rash and pain.  She was started on a course of Keflex by her PCP.  Patient tends to relate her symptoms of weakness to Keflex.  In the ED, teleneurology was consulted. CT head did not show any acute intracranial pathology. Admitted for stroke work-up Neurology consulted. MRI brain showed numerous small acute microembolic infarcts in the right MCA territory with no sign of hemorrhage or mass-effect.  It also showed chronic small vessel ischemic changes CT angio of head and neck showed bilateral carotid artery stenosis with right proximal ICA narrowing 80 to 90% with the possibility of luminal clot. 1/26, patient underwent right ICA stenting by neuro IR Dr. Corliss Skains.  Subjective: Patient was seen and examined this morning in neuro ICU.  Patient was alert, awake,, lying down in bed.  Not in distress.   Noted event from last night.  Patient had a drop in hemoglobin   Assessment/Plan: Acute stroke -Presented with left upper extremity weakness -MRI brain with numerous small acute microembolic infarcts in the right MCA territory -Stroke work-up ongoing -Echo with EF 60 to 65%,, no atrial level shunt. -LDL 46, A1c 5.9. -Prior to admission, patient was on pravastatin 40 mg daily. -Currently on aspirin 81 mg daily, Brilinta and pravastatin. -PT/OT eval eval obtained.  No follow-up recommended.  Bilateral carotid disease -With right ICA 80 to 90% stenosis -Vascular surgery  consulted.   -1/26, patient underwent right ICA stenting by IR with right common femoral artery approach -Antiplatelets and statin as above  Acute blood loss anemia -Presented with hemoglobin more than 11.  Hemoglobin dropped to 8.3 this morning. -CT scan showed active extravasation of blood from the common femoral artery which is at the site of catheter insertion for ICA stenting. -Hemoglobin better at 9.4 on repeat check this morning. Recent Labs    12/21/21 0050 12/22/21 0526 12/23/21 0337 12/24/21 0521 12/24/21 1126  HGB 11.6* 10.7* 11.1* 8.3* 9.4*  MCV 87.2 85.6 84.1 87.2  --    Sepsis secondary to right upper extremity cellulitis -Patient was scratched on her right forearm by her cat a couple of days ago after which she developed a rash and pain.  She was started on a course of Keflex by her PCP.   -She presented with leukocytosis, significantly elevated lactic acid level.   -Antibiotics was switched to doxycycline on admission.   -Cellulitis seems to be improving. -No fever, WBC count to repeat tomorrow.  Lactic acid level seem to have normalized -Follow-up blood culture report. Recent Labs  Lab 12/21/21 0050 12/21/21 0254 12/21/21 1644 12/21/21 1843 12/22/21 0345 12/22/21 0526 12/23/21 0337 12/24/21 0521  WBC 13.9*  --   --   --   --  5.0 6.2 5.3  LATICACIDVEN 3.6* 2.8* 1.1 1.2  --   --   --   --   PROCALCITON  --   --   --   --  0.53  --   --   --  Hyperlipidemia -Pravastatin  Anxiety/ Depression -Continue Xanax, Prozac, Abilify, trazodone  GERD -PPI  Mobility: Encourage ambulation Living condition: Lives at home Goals of care:   Code Status: Full Code  Nutritional status: Body mass index is 28.47 kg/m.      Diet:  Diet Order             Diet Heart Room service appropriate? Yes; Fluid consistency: Thin  Diet effective now                  DVT prophylaxis:  SCDs Start: 12/21/21 1636   Antimicrobials: Doxycycline Fluid:  None Consultants: Neurology Family Communication: None at bedside  Status is: Inpatient  Continue in-hospital care because: POD1, hemoglobin not stable yet Level of care: Telemetry Medical   Dispo: The patient is from: Home              Anticipated d/c is to: Pending PT eval,              Patient currently is not medically stable to d/c.   Difficult to place patient No     Infusions:   sodium chloride Stopped (12/23/21 2157)   doxycycline (VIBRAMYCIN) IV Stopped (12/24/21 1149)    Scheduled Meds:  ALPRAZolam  0.5 mg Oral TID   aspirin  81 mg Oral Daily   Or   aspirin  81 mg Per Tube Daily   Chlorhexidine Gluconate Cloth  6 each Topical Q0600   FLUoxetine  40 mg Oral Daily   pantoprazole  40 mg Oral Daily   pravastatin  40 mg Oral Daily   ticagrelor  90 mg Oral BID   Or   ticagrelor  90 mg Per Tube BID    PRN meds: acetaminophen **OR** acetaminophen (TYLENOL) oral liquid 160 mg/5 mL **OR** acetaminophen, HYDROcodone-acetaminophen, iohexol, iohexol   Antimicrobials: Anti-infectives (From admission, onward)    Start     Dose/Rate Route Frequency Ordered Stop   12/23/21 0000  vancomycin (VANCOCIN) IVPB 1000 mg/200 mL premix        1,000 mg 200 mL/hr over 60 Minutes Intravenous To Radiology 12/22/21 1422 12/24/21 0000   12/21/21 1000  doxycycline (VIBRAMYCIN) 100 mg in sodium chloride 0.9 % 250 mL IVPB        100 mg 125 mL/hr over 120 Minutes Intravenous Every 12 hours 12/21/21 0933     12/21/21 0600  Ampicillin-Sulbactam (UNASYN) 3 g in sodium chloride 0.9 % 100 mL IVPB  Status:  Discontinued        3 g 200 mL/hr over 30 Minutes Intravenous Every 8 hours 12/21/21 0530 12/21/21 0933   12/21/21 0015  ciprofloxacin (CIPRO) IVPB 400 mg        400 mg 200 mL/hr over 60 Minutes Intravenous  Once 12/21/21 0005 12/21/21 0502   12/21/21 0015  sulfamethoxazole-trimethoprim (BACTRIM DS) 800-160 MG per tablet 1 tablet        1 tablet Oral  Once 12/21/21 0005 12/21/21 0223        Objective: Vitals:   12/24/21 1100 12/24/21 1200  BP: (!) 117/58 125/60  Pulse: (!) 59 61  Resp: 16 17  Temp:  98.5 F (36.9 C)  SpO2: 91% 94%    Intake/Output Summary (Last 24 hours) at 12/24/2021 1330 Last data filed at 12/24/2021 1200 Gross per 24 hour  Intake 2834.9 ml  Output 1600 ml  Net 1234.9 ml    Filed Weights   12/21/21 0147 12/23/21 1410  Weight: 80.3 kg 80 kg  Weight change:  Body mass index is 28.47 kg/m.   Physical Exam: General exam: Pleasant, elderly Caucasian female.  Not in physical distress Skin: No rashes, lesions or ulcers. HEENT: Atraumatic, normocephalic, no obvious bleeding Lungs: Clear to auscultation bilaterally CVS: Regular rate and rhythm, no murmur GI/Abd soft, nontender, nondistended, bowel sound present CNS: Alert, awake, oriented x3, Psychiatry: Mood appropriate Extremities: No pedal edema, no calf tenderness.  Right forearm cellulitis is definitely improving.  Data Review: I have personally reviewed the laboratory data and studies available.  F/u labs ordered. Unresulted Labs (From admission, onward)    None       Signed, Lorin Glass, MD [facility] 12/24/2021

## 2021-12-24 NOTE — Consult Note (Signed)
Hospital Consult    Reason for Consult:  Right femoral pseudoaneurysm Referring Physician:  Dr. Estanislado Pandy MRN #:  [id number]  History of Present Illness This is a 71 y.o. female status post right ICA stenting yesterday by Dr. Estanislado Pandy with right common femoral 8 French sheath and Angio-Seal closure.  Patient dropped her hematocrit overnight stat CT scan was performed.  On evaluation the patient denies any foot or groin pain.  She is hoping to sit up and eat.   Heparin drip is stopped.  She is on aspirin and Brilinta.  History reviewed. No pertinent past medical history.  History reviewed. No pertinent surgical history.  No Known Allergies  Prior to Admission medications   Medication Sig Start Date End Date Taking? Authorizing Provider  ALPRAZolam Duanne Moron) 0.5 MG tablet Take 0.5 mg by mouth 3 (three) times daily.   Yes [provider]  cephALEXin (KEFLEX) 500 MG capsule Take 500 mg by mouth 3 (three) times daily. 12/20/21  Yes [provider]  FLUoxetine (PROZAC) 40 MG capsule Take 40 mg by mouth daily. 11/16/21  Yes [provider]  omeprazole (PRILOSEC) 20 MG capsule Take 20 mg by mouth daily.   Yes [provider]  pravastatin (PRAVACHOL) 40 MG tablet Take 40 mg by mouth daily.   Yes [provider]  traZODone (DESYREL) 150 MG tablet Take 300 mg by mouth at bedtime.   Yes [provider]  ARIPiprazole (ABILIFY) 5 MG tablet Take 5 mg by mouth daily. Patient not taking: Reported on 12/21/2021 12/12/21   [provider]    Social History   Socioeconomic History   Marital status: Single    Spouse name: Not on file   Number of children: Not on file   Years of education: Not on file   Highest education level: Not on file  Occupational History   Not on file  Tobacco Use   Smoking status: Former    Types: Cigarettes    Quit date: 04/09/2014    Years since quitting: 7.7   Smokeless tobacco: Not on file  Vaping Use    Vaping Use: Never used  Substance and Sexual Activity   Alcohol use: No   Drug use: No   Sexual activity: Not on file  Other Topics Concern   Not on file  Social History Narrative   Not on file   Social Determinants of Health   Financial Resource Strain: Not on file  Food Insecurity: Not on file  Transportation Needs: Not on file  Physical Activity: Not on file  Stress: Not on file  Social Connections: Not on file  Intimate Partner Violence: Not on file    History reviewed. No pertinent family history.  ROS   Physical Examination  Vitals:   12/24/21 0700 12/24/21 0800  BP: (!) 116/54 (!) 116/59  Pulse: 61 61  Resp: 15 15  Temp:  98.2 F (36.8 C)  SpO2: 94% 93%   Body mass index is 28.47 kg/m.  General:  nad HENT: WNL, normocephalic Pulmonary: normal non-labored breathing Cardiac: rrr, palpable right common femoral artery and right dp Abdomen: soft, NT/ND, no masses Extremities: no cce, well perfused feet Musculoskeletal: no muscle wasting or atrophy  Neurologic: A&O X 3; Appropriate Affect ; SENSATION: normal; MOTOR FUNCTION:  moving all extremities equally. Speech is fluent/normal   CBC    Component Value Date/Time   WBC 5.3 12/24/2021 0521   RBC 2.97 (L) 12/24/2021 0521   HGB 8.3 (L) 12/24/2021 [id number]  HCT 25.9 (L) 12/24/2021 0521   PLT 161 12/24/2021 0521   MCV 87.2 12/24/2021 0521   MCH 27.9 12/24/2021 0521   MCHC 32.0 12/24/2021 0521   RDW 12.5 12/24/2021 0521   LYMPHSABS 0.9 12/24/2021 0521   MONOABS 0.4 12/24/2021 0521   EOSABS 0.1 12/24/2021 0521   BASOSABS 0.0 12/24/2021 0521    BMET    Component Value Date/Time   NA 137 12/24/2021 0521   K 3.1 (L) 12/24/2021 0521   CL 109 12/24/2021 0521   CO2 22 12/24/2021 0521   GLUCOSE 129 (H) 12/24/2021 0521   BUN 5 (L) 12/24/2021 0521   CREATININE 0.82 12/24/2021 0521   CALCIUM 7.5 (L) 12/24/2021 0521   GFRNONAA >60 12/24/2021 0521   GFRAA >60 04/09/2016 0345    COAGS: Lab Results   Component Value Date   INR 1.0 12/23/2021   INR 1.1 12/22/2021   INR 1.1 12/21/2021     Non-Invasive Vascular Imaging:   CT IMPRESSION: VASCULAR   1. Right groin stranding with a nodular linear band of contrast extravasation from the anterior aspect of the right common femoral artery and heterogeneous fluid tracking from the distal aspect of the right external iliac along the common femoral and superficial femoral arteries with the distal aspect excluded from the field of view. Findings consistent with active extravasation likely related to prior vascular access with a probable small pseudoaneurysm. No retroperitoneal hemorrhage/hematoma.    Right Duplex PSV (cm/s)  Waveform  Plaque Comment(s)   +------------+----------+----------+------+----------+   CFA             155      biphasic                      +------------+----------+----------+------+----------+   PFA              99     monophasic                     +------------+----------+----------+------+----------+   Prox SFA        155      biphasic                      +------------+----------+----------+------+----------+   Right Vein comments:CFV patent by color/doppler     Summary: No evidence of pseudoaneurysm, AVF or DVT      ASSESSMENT/PLAN: This is a 71 y.o. female status post right ICA stenting from right common femoral approach.  Overnight her hematocrit dropped CT scan was obtained which demonstrated active extravasation from the common femoral artery.  She has no pain there at this time and does not appear to be actively bleeding with normal hemodynamics and mental status on evaluation.  No pseudoaneurysm evident by groin duplex possibly the extravasation has thrombosed with discontinuation of heparin.  Vascular surgery will be available as needed.   Asad Keeven C. Donzetta Matters, MD [facility] Office: [phone] Pager: [phone]

## 2021-12-24 NOTE — Progress Notes (Signed)
RLE pseudoaneurysm duplex study has been completed.  Preliminary results given to [facility], [place].   Results can be found under chart review under CV PROC. 12/24/2021 10:04 AM Zhara Gieske RVT, RDMS

## 2021-12-24 NOTE — Progress Notes (Signed)
Dr. Roda Shutters and Dr. Corliss Skains notified of radiology STAT results from CT Angio for GI bleed rule out. STAT ultrasound ordered for femoral site. No pain, bleeding, bruising at femoral site or around it.  Beryl Meager, RN

## 2021-12-24 NOTE — Progress Notes (Signed)
Physical Therapy Treatment Patient Details Name: Veronica Small MRN: [id number] DOB: November 02, 1951 Today's Date: 12/24/2021   History of Present Illness Veronica Small is a 71 y.o. female with PMH of anxiety/depression.  Pt presents with LUE weakness/numbness after reporting getting a cat scratch on her arm and needing to take Keflex. 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 s/p revascularization with stent 12/23/21. Concern for post op bleeding.    PT Comments    Patient s/p above procedure and per Dr. Corliss Skains, recommending light activity with PT today. Pt eager to get OOB and tolerated walking into bathroom with supervision for safety and to manage lines. Reports mild headache and some mild weakness from laying in the bed, otherwise feels well. Will likely progress well with mobility and be safe to d/c home. BP pre activity 104/60, post walking BP 152/77, sitting EOB after 5 mins BP 139/70. Will follow.   Recommendations for follow up therapy are one component of a multi-disciplinary discharge planning process, led by the attending physician.  Recommendations may be updated based on patient status, additional functional criteria and insurance authorization.  Follow Up Recommendations  No PT follow up     Assistance Recommended at Discharge PRN  Patient can return home with the following Assist for transportation;Assistance with cooking/housework   Equipment Recommendations  None recommended by PT    Recommendations for Other Services       Precautions / Restrictions Precautions Precautions: Fall;Other (comment) Precaution Comments: keep SBP <140, minimal activity today per Dr. Corliss Skains to monitor bleeding in fem site Restrictions Weight Bearing Restrictions: No     Mobility  Bed Mobility Overal bed mobility: Modified Independent Bed Mobility: Supine to Sit     Supine to sit: Modified independent (Device/Increase time), HOB elevated     General bed mobility  comments: No assist needed.    Transfers Overall transfer level: Modified independent Equipment used: None               General transfer comment: Stood from EOB x1 without difficulty. No dizziness, Stood from toilet x1.    Ambulation/Gait Ambulation/Gait assistance: Supervision Gait Distance (Feet): 15 Feet (x2 bouts) Assistive device: None Gait Pattern/deviations: Step-through pattern, Decreased stride length Gait velocity: decreased     General Gait Details: Slow, mostly steady gait, no evidence of imbalance. Deferred walking any farther due to MD orders.   Stairs             Wheelchair Mobility    Modified Rankin (Stroke Patients Only) Modified Rankin (Stroke Patients Only) Pre-Morbid Rankin Score: No symptoms Modified Rankin: Slight disability     Balance Overall balance assessment: Needs assistance Sitting-balance support: Feet supported, No upper extremity supported Sitting balance-Leahy Scale: Normal     Standing balance support: During functional activity Standing balance-Leahy Scale: Good                              Cognition Arousal/Alertness: Awake/alert Behavior During Therapy: WFL for tasks assessed/performed Overall Cognitive Status: Within Functional Limits for tasks assessed                                          Exercises      General Comments General comments (skin integrity, edema, etc.): Bp pre activity 104/60, post walking to BR BP 152/77, sitting EOB after 5  mins BP 139/70. RN aware and pt reports mild headache      Pertinent Vitals/Pain Pain Assessment Pain Assessment: 0-10 Pain Score: 2  Pain Location: headache Pain Descriptors / Indicators: Headache Pain Intervention(s): Monitored during session, RN gave pain meds during session, Repositioned    Home Living                          Prior Function            PT Goals (current goals can now be found in the care plan  section) Progress towards PT goals: Progressing toward goals    Frequency    Min 4X/week      PT Plan Current plan remains appropriate    Co-evaluation              AM-PAC PT "6 Clicks" Mobility   Outcome Measure  Help needed turning from your back to your side while in a flat bed without using bedrails?: None Help needed moving from lying on your back to sitting on the side of a flat bed without using bedrails?: None Help needed moving to and from a bed to a chair (including a wheelchair)?: None Help needed standing up from a chair using your arms (e.g., wheelchair or bedside chair)?: None Help needed to walk in hospital room?: A Little Help needed climbing 3-5 steps with a railing? : A Little 6 Click Score: 22    End of Session   Activity Tolerance: Patient tolerated treatment well Patient left: in bed;with call bell/phone within reach (sitting EOB) Nurse Communication: Mobility status PT Visit Diagnosis: Other abnormalities of gait and mobility (R26.89)     Time: 5465-0354 PT Time Calculation (min) (ACUTE ONLY): 19 min  Charges:  $Therapeutic Activity: 8-22 mins                     Vale Haven, PT, DPT Acute Rehabilitation Services Pager [phone] Office [phone]      Blake Divine A Lanier Ensign 12/24/2021, 3:59 PM

## 2021-12-24 NOTE — Progress Notes (Signed)
Pt seen by Dr. Corliss Skains this am. Events noted. Hgb drop to 8.3 but remained hemodynamically stable. CT concerning for active femoral extravasation likely related to prior vascular access with a probable small pseudoaneurysm. Pt feels okay otherwise  BP (!) 116/59 (BP Location: Right Arm)    Pulse 61    Temp 98.1 F (36.7 C) (Oral)    Resp 15    Ht 5\' 6"  (1.676 m)    Wt 80 kg    SpO2 93%    BMI 28.47 kg/m   A&O x 3 PERRLA, EOMI Face symmetric, tongue midline No drift Fine motor and strength intact (R)groin dressing still with some serosanguinous staining, no change from yesterday. Otherwise soft, NT Feet warm, pedal pulses palpable.  Have ordered duplex to further assess for pseudoaneurysm. Dr. [place] has also contact [facility] for consultation.  Corliss Skains PA-C Interventional Radiology 12/24/2021 9:00 AM

## 2021-12-25 MED ORDER — HYDROCODONE-ACETAMINOPHEN 5-325 MG PO TABS: 1.0000 | ORAL_TABLET | Freq: Four times a day (QID) | ORAL | 0 refills | Status: AC | PRN

## 2021-12-25 MED ORDER — ASPIRIN 81 MG PO CHEW: 81.0000 mg | CHEWABLE_TABLET | Freq: Every day | ORAL | 2 refills | Status: AC

## 2021-12-25 MED ORDER — TICAGRELOR 90 MG PO TABS: 90.0000 mg | ORAL_TABLET | Freq: Two times a day (BID) | ORAL | 2 refills | Status: AC

## 2021-12-25 NOTE — Progress Notes (Signed)
Patient ID: Veronica Small, female   DOB: 01/14/1951, 71 y.o.   MRN: [id number]   Pt seen with Dr Corliss Skains today IR procedure 12/23/21:  Severe Prox Rt ICA stenosis. S/P revascularization with stent assisted angioplasty with distal protection.  Post procedure CT yesterday: Right groin stranding with a nodular linear band of contrast extravasation from the anterior aspect of the right common femoral artery and heterogeneous fluid tracking from the distal aspect of the right external iliac along the common femoral and superficial femoral arteries with the distal aspect excluded from the field of view. Findings consistent with active extravasation likely related to prior vascular access with a probable small pseudoaneurysm. No retroperitoneal hemorrhage/hematoma.  Doppler  yesterday:  Right Vein comments:CFV patent by color/doppler Summary: No evidence of pseudoaneurysm, AVF or DVT  Exam today: Rt groin NT no bleeding No hematoma Rt foot good pulses  Pt for DC per MD  Pt to follow with Dr Corliss Skains 2-3 weeks-- She will hear from scheduler for appointment OP order in chart

## 2021-12-25 NOTE — Progress Notes (Addendum)
STROKE TEAM PROGRESS NOTE   SUBJECTIVE (INTERVAL HISTORY) PT at the bedside. Patient ambulating in room unassisted.  Rt ICA stenting with Dr. Estanislado Pandy 1/26. Pt awake alert, neuro unchanged from yesterday, left thumb numbness improving. Plan to discharge today per hospitalist service.  OBJECTIVE Temp:  [98 F (36.7 C)-99.3 F (37.4 C)] 99.3 F (37.4 C) (01/28 0825) Pulse Rate:  [59-77] 77 (01/28 0825) Cardiac Rhythm: Normal sinus rhythm (01/28 0700) Resp:  [13-28] 16 (01/28 0825) BP: (86-165)/(51-95) 152/64 (01/28 0825) SpO2:  [91 %-100 %] 97 % (01/28 0825) Arterial Line BP: (127-140)/(52-63) 140/63 (01/27 1100) Weight:  [81 kg] 81 kg (01/27 2302)  Recent Labs  Lab 12/21/21 0735  GLUCAP 133*    Recent Labs  Lab 12/21/21 0050 12/22/21 0345 12/23/21 0337 12/24/21 0521  NA 137 137 136 137  K 3.8 3.5 3.8 3.1*  CL 107 108 109 109  CO2 21* 20* 22 22  GLUCOSE 175* 111* 106* 129*  BUN 14 8 8  5*  CREATININE 1.11* 0.91 0.94 0.82  CALCIUM 8.0* 8.0* 8.0* 7.5*    Recent Labs  Lab 12/21/21 0050 12/22/21 0345  AST 27 21  ALT 18 17  ALKPHOS 77 57  BILITOT 0.5 0.5  PROT 6.2* 5.1*  ALBUMIN 3.4* 2.8*    Recent Labs  Lab 12/21/21 0050 12/22/21 0526 12/23/21 0337 12/24/21 0521 12/24/21 1126  WBC 13.9* 5.0 6.2 5.3  --   NEUTROABS 12.6*  --  4.4 3.9  --   HGB 11.6* 10.7* 11.1* 8.3* 9.4*  HCT 36.8 32.8* 33.8* 25.9* 28.9*  MCV 87.2 85.6 84.1 87.2  --   PLT 210 173 203 161  --     No results for input(s): CKTOTAL, CKMB, CKMBINDEX, TROPONINI in the last 168 hours. Recent Labs    12/23/21 0337  LABPROT 13.2  INR 1.0    No results for input(s): COLORURINE, LABSPEC, PHURINE, GLUCOSEU, HGBUR, BILIRUBINUR, KETONESUR, PROTEINUR, UROBILINOGEN, NITRITE, LEUKOCYTESUR in the last 72 hours.  Invalid input(s): APPERANCEUR      Component Value Date/Time   CHOL 105 12/22/2021 0345   TRIG 63 12/22/2021 0345   HDL 46 12/22/2021 0345   CHOLHDL 2.3 12/22/2021 0345   VLDL 13  12/22/2021 0345   LDLCALC 46 12/22/2021 0345   Lab Results  Component Value Date   HGBA1C 5.9 (H) 12/22/2021      Component Value Date/Time   LABOPIA NONE DETECTED 12/21/2021 0200   COCAINSCRNUR NONE DETECTED 12/21/2021 0200   LABBENZ POSITIVE (A) 12/21/2021 0200   AMPHETMU POSITIVE (A) 12/21/2021 0200   THCU NONE DETECTED 12/21/2021 0200   LABBARB NONE DETECTED 12/21/2021 0200    Recent Labs  Lab 12/21/21 0050  ETH <10     I have personally reviewed the radiological images below and agree with the radiology interpretations.  CT Angio Head W or Wo Contrast  Result Date: 12/21/2021 CLINICAL DATA:  Stroke follow-up.  Near syncope with slurred speech EXAM: CT ANGIOGRAPHY HEAD AND NECK TECHNIQUE: Multidetector CT imaging of the head and neck was performed using the standard protocol during bolus administration of intravenous contrast. Multiplanar CT image reconstructions and MIPs were obtained to evaluate the vascular anatomy. Carotid stenosis measurements (when applicable) are obtained utilizing NASCET criteria, using the distal internal carotid diameter as the denominator. RADIATION DOSE REDUCTION: This exam was performed according to the departmental dose-optimization program which includes automated exposure control, adjustment of the mA and/or kV according to patient size and/or use of iterative reconstruction technique. CONTRAST:  86mL OMNIPAQUE IOHEXOL 350 MG/ML SOLN COMPARISON:  Head CT from earlier today FINDINGS: CTA NECK FINDINGS Aortic arch: Atheromatous calcification with 3 vessel branching. Right carotid system: Atheromatous plaque, heavily calcified, at the bifurcation/proximal ICA but the lumen cannot be measured/assessed given the degree of laryngeal motion. Flow reducing stenosis is definitely possible. No downstream stenosis or beading. Left carotid system: Atheromatous plaque especially heavy calcification at the bifurcation and proximal ICA. Due to laryngeal artifact the  lumen cannot be assessed/measured. Possible flow limiting stenosis. No evidence of dissection. Vertebral arteries: Proximal left subclavian atheromatous plaque. No flow limiting stenosis. Dominant left vertebral artery. Both vertebral arteries are smoothly contoured and widely patent to the dura. Skeleton: Cervical spine degeneration especially affecting left-sided facets. Other neck: No acute finding.  Motion artifact Upper chest: Clear apical lungs. Review of the MIP images confirms the above findings CTA HEAD FINDINGS Anterior circulation: Atheromatous calcification of the carotid siphons. No branch occlusion, beading, or aneurysm. Posterior circulation: Vertebral and basilar arteries are smoothly contoured and widely patent. Left vertebral artery is dominant right vertebral artery ending in PICA. No branch occlusion, beading, or aneurysm. Venous sinuses: Diffusely patent Anatomic variants: None significant Review of the MIP images confirms the above findings IMPRESSION: 1. No emergent finding. No evidence of vertebrobasilar insufficiency. 2. Most notable atheromatous plaque is at the bilateral carotid bifurcation. Nondiagnostic assessment of associated stenoses given patient motion, flow reducing stenosis could be present on either side and follow-up for carotid Doppler or repeat CTA recommended. Electronically Signed   By: Jorje Guild M.D.   On: 12/21/2021 06:11   CT ANGIO HEAD NECK W WO CM  Result Date: 12/22/2021 CLINICAL DATA:  Stroke workup EXAM: CT ANGIOGRAPHY HEAD AND NECK TECHNIQUE: Multidetector CT imaging of the head and neck was performed using the standard protocol during bolus administration of intravenous contrast. Multiplanar CT image reconstructions and MIPs were obtained to evaluate the vascular anatomy. Carotid stenosis measurements (when applicable) are obtained utilizing NASCET criteria, using the distal internal carotid diameter as the denominator. RADIATION DOSE REDUCTION: This exam  was performed according to the departmental dose-optimization program which includes automated exposure control, adjustment of the mA and/or kV according to patient size and/or use of iterative reconstruction technique. CONTRAST:  65mL OMNIPAQUE IOHEXOL 350 MG/ML SOLN COMPARISON:  Brain MRI from yesterday FINDINGS: CT HEAD FINDINGS Brain: Known small acute infarcts along the cortex at the high right cerebrum. No hemorrhage, hydrocephalus, or gross progression. Vascular: See below Skull: No acute finding Sinuses: Small presumed retention cyst in the left maxillary sinus. Orbits: Unremarkable Review of the MIP images confirms the above findings CTA NECK FINDINGS Aortic arch: Atheromatous calcifications. Three vessel branching. No acute finding. Right carotid system: Motion artifact again complicates evaluation of the carotid bifurcations. Still, there is better visualization of the bifurcation where there is mixed density plaque with proximal ICA luminal narrowing to less than 1 mm, 80-90% stenosis based on sagittal reformats. Bifurcation is below the right mandibular angle. On axial slices there is a seemingly luminal low-density projection suggesting thrombus, although less apparent on sagittal images. Left carotid system: Motion artifact limits evaluation of the bifurcation. There is mixed density, heavily calcified plaque the bifurcation with proximal ICA stenosis estimated at over 75%. No ulceration or dissection seen. Vertebral arteries: Proximal subclavian atherosclerosis without flow limiting stenosis. The left vertebral artery is dominant. Skeleton: No emergent finding. Asymmetric left degenerative facet spurring Other neck: No acute finding Upper chest: No acute finding Review of the MIP images  confirms the above findings CTA HEAD FINDINGS Anterior circulation: Atheromatous plaque on the carotid siphons. No major branch occlusion, flow limiting stenosis, aneurysm, or vascular malformation. Posterior  circulation: Left dominant vertebral artery. Right vertebral artery ends in PICA. No branch occlusion, beading, or aneurysm. Venous sinuses: Patent Anatomic variants: None significant Review of the MIP images confirms the above findings IMPRESSION: Exam is again complicated by patient motion degrading visualization of the carotid bifurcations, but improved from before and showing definite flow reducing stenosis at both proximal ICA. Worse disease on the right where proximal ICA narrowing measures 80-90% and there may be some luminal clot. 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 No gross progression and no interval hemorrhage. Electronically Signed   By: Jorje Guild M.D.   On: 12/22/2021 05:25   CT HEAD WO CONTRAST  Result Date: 12/21/2021 CLINICAL DATA:  Transient ischemic attack. EXAM: CT HEAD WITHOUT CONTRAST TECHNIQUE: Contiguous axial images were obtained from the base of the skull through the vertex without intravenous contrast. RADIATION DOSE REDUCTION: This exam was performed according to the departmental dose-optimization program which includes automated exposure control, adjustment of the mA and/or kV according to patient size and/or use of iterative reconstruction technique. COMPARISON:  Head CT dated 01/02/2009. FINDINGS: Brain: Mild age-related atrophy and chronic microvascular ischemic changes. There is no acute intracranial hemorrhage. No mass effect or midline shift. No extra-axial fluid collection. Vascular: No hyperdense vessel or unexpected calcification. Skull: Normal. Negative for fracture or focal lesion. Sinuses/Orbits: There is mucoperiosteal thickening of paranasal sinuses. No air-fluid level. The mastoid air cells are clear. Other: None IMPRESSION: 1. No acute intracranial pathology. 2. Mild age-related atrophy and chronic microvascular ischemic changes. Electronically Signed   By: Anner Crete M.D.   On: 12/21/2021 01:35   CT Angio Neck W and/or Wo  Contrast  Result Date: 12/21/2021 CLINICAL DATA:  Stroke follow-up.  Near syncope with slurred speech EXAM: CT ANGIOGRAPHY HEAD AND NECK TECHNIQUE: Multidetector CT imaging of the head and neck was performed using the standard protocol during bolus administration of intravenous contrast. Multiplanar CT image reconstructions and MIPs were obtained to evaluate the vascular anatomy. Carotid stenosis measurements (when applicable) are obtained utilizing NASCET criteria, using the distal internal carotid diameter as the denominator. RADIATION DOSE REDUCTION: This exam was performed according to the departmental dose-optimization program which includes automated exposure control, adjustment of the mA and/or kV according to patient size and/or use of iterative reconstruction technique. CONTRAST:  83mL OMNIPAQUE IOHEXOL 350 MG/ML SOLN COMPARISON:  Head CT from earlier today FINDINGS: CTA NECK FINDINGS Aortic arch: Atheromatous calcification with 3 vessel branching. Right carotid system: Atheromatous plaque, heavily calcified, at the bifurcation/proximal ICA but the lumen cannot be measured/assessed given the degree of laryngeal motion. Flow reducing stenosis is definitely possible. No downstream stenosis or beading. Left carotid system: Atheromatous plaque especially heavy calcification at the bifurcation and proximal ICA. Due to laryngeal artifact the lumen cannot be assessed/measured. Possible flow limiting stenosis. No evidence of dissection. Vertebral arteries: Proximal left subclavian atheromatous plaque. No flow limiting stenosis. Dominant left vertebral artery. Both vertebral arteries are smoothly contoured and widely patent to the dura. Skeleton: Cervical spine degeneration especially affecting left-sided facets. Other neck: No acute finding.  Motion artifact Upper chest: Clear apical lungs. Review of the MIP images confirms the above findings CTA HEAD FINDINGS Anterior circulation: Atheromatous calcification of  the carotid siphons. No branch occlusion, beading, or aneurysm. Posterior circulation: Vertebral and basilar arteries are smoothly contoured and  widely patent. Left vertebral artery is dominant right vertebral artery ending in PICA. No branch occlusion, beading, or aneurysm. Venous sinuses: Diffusely patent Anatomic variants: None significant Review of the MIP images confirms the above findings IMPRESSION: 1. No emergent finding. No evidence of vertebrobasilar insufficiency. 2. Most notable atheromatous plaque is at the bilateral carotid bifurcation. Nondiagnostic assessment of associated stenoses given patient motion, flow reducing stenosis could be present on either side and follow-up for carotid Doppler or repeat CTA recommended. Electronically Signed   By: Jorje Guild M.D.   On: 12/21/2021 06:11   MR BRAIN WO CONTRAST  Result Date: 12/21/2021 CLINICAL DATA:  Neuro deficit, acute, stroke suspected. Near syncopal episode. Tremors. Slurred speech. Facial droop. EXAM: MRI HEAD WITHOUT CONTRAST TECHNIQUE: Multiplanar, multiecho pulse sequences of the brain and surrounding structures were obtained without intravenous contrast. COMPARISON:  CT studies earlier same day.  MRI 01/03/2009. FINDINGS: Brain: No abnormality affects the brainstem or cerebellum. Left cerebral hemisphere shows minimal chronic small-vessel change of the white matter. Right hemisphere shows numerous acute infarctions affecting the cortical brain in the right middle cerebral artery territory consistent with micro embolic infarctions. No large or confluent infarction. No mass effect or hemorrhage. Mild chronic small-vessel change affects the deep white matter of the right hemisphere as well as the left. No hydrocephalus or extra-axial collection. No mass lesion. Vascular: Major vessels at the base of the brain show flow. Skull and upper cervical spine: Negative Sinuses/Orbits: Clear/normal Other: None IMPRESSION: Numerous small acute micro  embolic infarctions in the right middle cerebral artery territory. No large confluent infarction. No sign of hemorrhage or mass effect. Otherwise, the brain has a good appearance for age, with only mild chronic small-vessel ischemic changes of the cerebral hemispheric white matter. Electronically Signed   By: Nelson Chimes M.D.   On: 12/21/2021 10:09   VAS [place] GROIN PSEUDOANEURYSM  Result Date: 12/24/2021  ARTERIAL PSEUDOANEURYSM  Patient Name:  Veronica Small  Date of Exam:   12/24/2021 Medical Rec #: [id number]         Accession #:    [id number] Date of Birth: 01-21-1951         Patient Gender: F Patient Age:   60 years Exam Location:  [facility] Procedure:      VAS [place] GROIN PSEUDOANEURYSM Referring Phys: Rosalin Hawking --------------------------------------------------------------------------------  Exam: Right groin Indications: Patient complains of swelling post procedure. History: S/p catheterization. Comparison Study: No previous exams Performing Technologist: Jody Hill RVT, RDMS  Examination Guidelines: A complete evaluation includes B-mode imaging, spectral Doppler, color Doppler, and power Doppler as needed of all accessible portions of each vessel. Bilateral testing is considered an integral part of a complete examination. Limited examinations for reoccurring indications may be performed as noted. +------------+----------+----------+------+----------+  Right Duplex PSV (cm/s)  Waveform  Plaque Comment(s)  +------------+----------+----------+------+----------+  CFA             155      biphasic                     +------------+----------+----------+------+----------+  PFA              99     monophasic                    +------------+----------+----------+------+----------+  Prox SFA        155      biphasic                     +------------+----------+----------+------+----------+  Right Vein comments:CFV patent by color/doppler  Summary: No evidence of pseudoaneurysm, AVF or DVT  Diagnosing  physician: Servando Snare MD Electronically signed by Servando Snare MD on 12/24/2021 at 12:48:46 PM.    --------------------------------------------------------------------------------    Final    ECHOCARDIOGRAM COMPLETE  Result Date: 12/21/2021    ECHOCARDIOGRAM REPORT   Patient Name:   Veronica Small Date of Exam: 12/21/2021 Medical Rec #:  [id number]        Height:       66.0 in Accession #:    [id number]       Weight:       177.0 lb Date of Birth:  [birth year]        BSA:          1.899 m Patient Age:    43 years         BP:           156/84 mmHg Patient Gender: F                HR:           75 bpm. Exam Location:  Inpatient Procedure: 2D Echo, Cardiac Doppler and Color Doppler Indications:    Stroke  History:        Patient has no prior history of Echocardiogram examinations.                 Risk Factors:Former Smoker.  Sonographer:    Glo Herring Referring Phys: [id number] [place]  1. Left ventricular ejection fraction, by estimation, is 60 to 65%. The left ventricle has normal function. The left ventricle has no regional wall motion abnormalities. Left ventricular diastolic parameters were normal.  2. Right ventricular systolic function is normal. The right ventricular size is normal. Tricuspid regurgitation signal is inadequate for assessing PA pressure.  3. The mitral valve is normal in structure. No evidence of mitral valve regurgitation. No evidence of mitral stenosis.  4. The aortic valve was not well visualized. Aortic valve regurgitation is not visualized. No aortic stenosis is present. Comparison(s): No prior Echocardiogram. Conclusion(s)/Recommendation(s): No intracardiac source of embolism detected on this transthoracic study. Consider a transesophageal echocardiogram to exclude cardiac source of embolism if clinically indicated. FINDINGS  Left Ventricle: Left ventricular ejection fraction, by estimation, is 60 to 65%. The left ventricle has normal function. The left ventricle has  no regional wall motion abnormalities. The left ventricular internal cavity size was normal in size. There is  no left ventricular hypertrophy. Left ventricular diastolic parameters were normal. Right Ventricle: Calcified papillary muscle. The right ventricular size is normal. No increase in right ventricular wall thickness. Right ventricular systolic function is normal. Tricuspid regurgitation signal is inadequate for assessing PA pressure. Left Atrium: Left atrial size was normal in size. Right Atrium: Right atrial size was normal in size. Pericardium: There is no evidence of pericardial effusion. Mitral Valve: The mitral valve is normal in structure. No evidence of mitral valve regurgitation. No evidence of mitral valve stenosis. Tricuspid Valve: The tricuspid valve is normal in structure. Tricuspid valve regurgitation is not demonstrated. No evidence of tricuspid stenosis. Aortic Valve: The aortic valve was not well visualized. Aortic valve regurgitation is not visualized. No aortic stenosis is present. Aortic valve mean gradient measures 4.5 mmHg. Aortic valve peak gradient measures 8.4 mmHg. Aortic valve area, by VTI measures 2.01 cm. Pulmonic Valve: The pulmonic valve was normal in structure. Pulmonic valve regurgitation is not visualized. No evidence of pulmonic stenosis. Aorta:  The aortic root and ascending aorta are structurally normal, with no evidence of dilitation. Venous: The inferior vena cava was not well visualized. IAS/Shunts: No atrial level shunt detected by color flow Doppler.  LEFT VENTRICLE PLAX 2D LVIDd:         4.70 cm   Diastology LVIDs:         3.10 cm   LV e' medial:    9.46 cm/s LV PW:         1.00 cm   LV E/e' medial:  11.7 LV IVS:        1.00 cm   LV e' lateral:   10.60 cm/s LVOT diam:     1.80 cm   LV E/e' lateral: 10.5 LV SV:         66 LV SV Index:   35 LVOT Area:     2.54 cm  RIGHT VENTRICLE RV S prime:     12.30 cm/s LEFT ATRIUM           Index LA diam:      4.30 cm 2.26 cm/m  LA Vol (A2C): 37.4 ml 19.70 ml/m  AORTIC VALVE                     PULMONIC VALVE AV Area (Vmax):    2.02 cm      PV Vmax:       1.13 m/s AV Area (Vmean):   2.08 cm      PV Peak grad:  5.1 mmHg AV Area (VTI):     2.01 cm AV Vmax:           145.00 cm/s AV Vmean:          100.400 cm/s AV VTI:            0.328 m AV Peak Grad:      8.4 mmHg AV Mean Grad:      4.5 mmHg LVOT Vmax:         115.00 cm/s LVOT Vmean:        81.900 cm/s LVOT VTI:          0.259 m LVOT/AV VTI ratio: 0.79  AORTA Ao Root diam: 2.90 cm Ao Asc diam:  3.00 cm MITRAL VALVE MV Area (PHT): 5.09 cm     SHUNTS MV Decel Time: 149 msec     Systemic VTI:  0.26 m MV E velocity: 111.00 cm/s  Systemic Diam: 1.80 cm MV A velocity: 123.00 cm/s MV E/A ratio:  0.90 Rudean Haskell MD Electronically signed by Rudean Haskell MD Signature Date/Time: 12/21/2021/3:03:59 PM    Final    VAS US CAROTID (at [facility] and [facility] only)  Result Date: 12/23/2021 Carotid Arterial Duplex Study Patient Name:  Veronica Small  Date of Exam:   12/22/2021 Medical Rec #: [id number]         Accession #:    [id number] Date of Birth: 05-11-1951         Patient Gender: F Patient Age:   23 years Exam Location:  [facility] Procedure:      VAS US CAROTID Referring Phys: Cherylann Ratel --------------------------------------------------------------------------------  Indications:       CVA and Carotid artery disease. Risk Factors:      Hyperlipidemia. Comparison Study:  No prior study Performing Technologist: Maudry Mayhew MHA, RDMS, RVT, RDCS  Examination Guidelines: A complete evaluation includes B-mode imaging, spectral Doppler, color Doppler, and power Doppler as needed of all accessible portions of each vessel. Bilateral  testing is considered an integral part of a complete examination. Limited examinations for reoccurring indications may be performed as noted.  Right Carotid Findings: +----------+--------+--------+--------+-------------------------+---------+              PSV cm/s EDV cm/s Stenosis Plaque Description        Comments   +----------+--------+--------+--------+-------------------------+---------+  CCA Prox   70       12                                                     +----------+--------+--------+--------+-------------------------+---------+  CCA Distal 66       15                calcific and heterogenous Shadowing  +----------+--------+--------+--------+-------------------------+---------+  ICA Prox   220      71       60-79%   calcific and heterogenous Shadowing  +----------+--------+--------+--------+-------------------------+---------+  ICA Mid    105      26                                                     +----------+--------+--------+--------+-------------------------+---------+  ICA Distal 109      33                                                     +----------+--------+--------+--------+-------------------------+---------+  ECA        453      88       >50%                                          +----------+--------+--------+--------+-------------------------+---------+ +----------+--------+-------+----------------+-------------------+             PSV cm/s EDV cms Describe         Arm Pressure (mmHG)  +----------+--------+-------+----------------+-------------------+  Subclavian 125              Multiphasic, WNL                      +----------+--------+-------+----------------+-------------------+ +---------+--------+--+--------+--+---------+  Vertebral PSV cm/s 59 EDV cm/s 14 Antegrade  +---------+--------+--+--------+--+---------+  Left Carotid Findings: +----------+--------+--------+--------+------------------------------+---------+             PSV cm/s EDV cm/s Stenosis Plaque Description             Comments   +----------+--------+--------+--------+------------------------------+---------+  CCA Prox   93       24                                                           +----------+--------+--------+--------+------------------------------+---------+  CCA Distal 82       21                irregular and calcific  Shadowing  +----------+--------+--------+--------+------------------------------+---------+  ICA Prox   289      74       60-79%   heterogenous, irregular and    Shadowing                                         calcific                                  +----------+--------+--------+--------+------------------------------+---------+  ICA Mid    205      51                                                          +----------+--------+--------+--------+------------------------------+---------+  ICA Distal 90       18                                                          +----------+--------+--------+--------+------------------------------+---------+  ECA        227      23                                                          +----------+--------+--------+--------+------------------------------+---------+ +----------+--------+--------+----------------+-------------------+             PSV cm/s EDV cm/s Describe         Arm Pressure (mmHG)  +----------+--------+--------+----------------+-------------------+  Subclavian 107               Multiphasic, WNL                      +----------+--------+--------+----------------+-------------------+ +---------+--------+--+--------+--+---------+  Vertebral PSV cm/s 93 EDV cm/s 23 Antegrade  +---------+--------+--+--------+--+---------+   Summary: Right Carotid: Velocities in the right ICA are consistent with a 60-79%                stenosis. The ECA appears >50% stenosed. Left Carotid: Velocities in the left ICA are consistent with a 60-79% stenosis. Vertebrals:  Bilateral vertebral arteries demonstrate antegrade flow. Subclavians: Normal flow hemodynamics were seen in bilateral subclavian              arteries. *See table(s) above for measurements and observations.  Electronically signed by Antony Contras MD on 12/23/2021 at  8:17:38 AM.    Final    VAS [place] LOWER EXTREMITY VENOUS (DVT)  Result Date: 12/22/2021  Lower Venous DVT Study Patient Name:  Veronica Small  Date of Exam:   12/22/2021 Medical Rec #: [id number]         Accession #:    [id number] Date of Birth: [birth year]         Patient Gender: F Patient Age:   56 years Exam Location:  [facility] Procedure:      VAS [place] LOWER EXTREMITY VENOUS (DVT) Referring Phys: Cornelius Moras XU --------------------------------------------------------------------------------  Indications: Stroke.  Comparison Study: No prior study Performing Technologist: Maudry Mayhew MHA, RDMS, RVT, RDCS  Examination Guidelines: A complete evaluation includes B-mode imaging, spectral Doppler, color Doppler, and power Doppler as needed of all accessible portions of each vessel. Bilateral testing is considered an integral part of a complete examination. Limited examinations for reoccurring indications may be performed as noted. The reflux portion of the exam is performed with the patient in reverse Trendelenburg.  +---------+---------------+---------+-----------+----------+--------------+  RIGHT     Compressibility Phasicity Spontaneity Properties Thrombus Aging  +---------+---------------+---------+-----------+----------+--------------+  CFV       Full            Yes       Yes                                    +---------+---------------+---------+-----------+----------+--------------+  SFJ       Full                                                             +---------+---------------+---------+-----------+----------+--------------+  FV Prox   Full                                                             +---------+---------------+---------+-----------+----------+--------------+  FV Mid    Full                                                             +---------+---------------+---------+-----------+----------+--------------+  FV Distal Full                                                              +---------+---------------+---------+-----------+----------+--------------+  PFV       Full                                                             +---------+---------------+---------+-----------+----------+--------------+  POP       Full            Yes       Yes                                    +---------+---------------+---------+-----------+----------+--------------+  PTV       Full                                                             +---------+---------------+---------+-----------+----------+--------------+  PERO      Full                                                             +---------+---------------+---------+-----------+----------+--------------+   +---------+---------------+---------+-----------+----------+--------------+  LEFT      Compressibility Phasicity Spontaneity Properties Thrombus Aging  +---------+---------------+---------+-----------+----------+--------------+  CFV       Full            Yes       Yes                                    +---------+---------------+---------+-----------+----------+--------------+  SFJ       Full                                                             +---------+---------------+---------+-----------+----------+--------------+  FV Prox   Full                                                             +---------+---------------+---------+-----------+----------+--------------+  FV Mid    Full                                                             +---------+---------------+---------+-----------+----------+--------------+  FV Distal Full                                                             +---------+---------------+---------+-----------+----------+--------------+  PFV       Full                                                             +---------+---------------+---------+-----------+----------+--------------+  POP       Full            Yes       Yes                                     +---------+---------------+---------+-----------+----------+--------------+  PTV       Full                                                             +---------+---------------+---------+-----------+----------+--------------+  PERO      Full                                                             +---------+---------------+---------+-----------+----------+--------------+     Summary: RIGHT: - There is no evidence of deep vein thrombosis in the lower extremity.  - No cystic structure found in the popliteal fossa.  LEFT: - There is no evidence of deep vein thrombosis in the lower extremity.  - No cystic structure found in the popliteal fossa.  *See table(s) above for measurements and observations. Electronically signed by Harold Barban MD on 12/22/2021 at 9:16:30 PM.    Final    CT ANGIO GI BLEED  Result Date: 12/24/2021 CLINICAL DATA:  Concern for retroperitoneal bleed/hematoma and GI bleed. EXAM: CTA ABDOMEN AND PELVIS WITHOUT AND WITH CONTRAST TECHNIQUE: Multidetector CT imaging of the abdomen and pelvis was performed using the standard protocol during bolus administration of intravenous contrast. Multiplanar reconstructed images and MIPs were obtained and reviewed to evaluate the vascular anatomy. RADIATION DOSE REDUCTION: This exam was performed according to the departmental dose-optimization program which includes automated exposure control, adjustment of the mA and/or kV according to patient size and/or use of iterative reconstruction technique. CONTRAST:  134mL OMNIPAQUE IOHEXOL 350 MG/ML SOLN COMPARISON:  Abdominal ultrasound March 202012 FINDINGS: VASCULAR Aorta: Aortic atherosclerosis without aneurysmal dilation, dissection or significant stenosis. Celiac and SMA: Arise by a common trunk from the aorta. Both are patent without evidence of aneurysm, dissection, vasculitis or significant stenosis. Renals: Both renal arteries are patent without evidence of aneurysm, dissection, vasculitis,  fibromuscular dysplasia or significant stenosis. IMA: Patent without evidence of aneurysm, dissection, vasculitis or significant stenosis. Inflow: Patent without evidence of aneurysm, dissection, vasculitis or significant stenosis. Proximal Outflow: Bilateral common femoral and visualized portions of the superficial and profunda femoral arteries are patent. Nodular linear band of contrast extends from the anterior aspect of the common femoral artery on image 201/8 with heterogeneous fluid tracking from the distal aspect of the external iliac along the common femoral and superficial femoral arteries with the distal aspect excluded from the field of view. Veins: No obvious venous abnormality within the limitations of this arterial phase study. Review of the MIP images confirms the above findings. NON-VASCULAR Lower chest: Mild diffuse bronchial wall thickening in the lung bases. Combination of scarring and atelectasis in the lung bases. Hepatobiliary: No suspicious hepatic lesion. Vicarious secretion of contrast in the gallbladder. No evidence of acute gallbladder inflammation. No biliary ductal dilation. Pancreas: No pancreatic ductal dilation or evidence of acute inflammation. Spleen: Normal in size without focal abnormality. Adrenals/Urinary Tract: Bilateral adrenal glands appear normal. No hydronephrosis. Kidneys demonstrate symmetric enhancement and excretion of contrast material. No solid enhancing renal mass. Urinary bladder is decompressed around a Foley catheter. Stomach/Bowel: No enteric contrast was administered. Small hiatal hernia. Stomach is unremarkable for degree of distension. No pathologic dilation of small or large bowel. Terminal ileum appears normal. The appendix is not confidently identified however there is no pericecal inflammation. No evidence of acute bowel inflammation. No findings to indicate active gastrointestinal hemorrhage. Lymphatic: No pathologically enlarged abdominal or pelvic lymph  nodes. Reproductive: Uterus and bilateral adnexa are unremarkable. Other: Stranding in the right groin with vascular/perivascular findings described above. Musculoskeletal: No acute or significant osseous findings. IMPRESSION:  VASCULAR 1. Right groin stranding with a nodular linear band of contrast extravasation from the anterior aspect of the right common femoral artery and heterogeneous fluid tracking from the distal aspect of the right external iliac along the common femoral and superficial femoral arteries with the distal aspect excluded from the field of view. Findings consistent with active extravasation likely related to prior vascular access with a probable small pseudoaneurysm. No retroperitoneal hemorrhage/hematoma. NON-VASCULAR 1. No evidence of active gastrointestinal bleed. 2. Mild diffuse bronchial wall thickening in the lung bases with associated scarring and atelectasis. These results will be called to the ordering clinician or representative by the Radiologist Assistant, and communication documented in the PACS or [employer]. Electronically Signed   By: Dahlia Bailiff M.D.   On: 12/24/2021 07:39     PHYSICAL EXAM  Temp:  [98 F (36.7 C)-99.3 F (37.4 C)] 99.3 F (37.4 C) (01/28 0825) Pulse Rate:  [59-77] 77 (01/28 0825) Resp:  [13-28] 16 (01/28 0825) BP: (86-165)/(51-95) 152/64 (01/28 0825) SpO2:  [91 %-100 %] 97 % (01/28 0825) Arterial Line BP: (127-140)/(52-63) 140/63 (01/27 1100) Weight:  [81 kg] 81 kg (01/27 2302)  General - Well nourished, well developed, in no apparent distress.  Ophthalmologic - fundi not visualized due to noncooperation.  Cardiovascular - Regular rhythm and rate.  Mental Status -  Level of arousal and orientation to time, place, and person were intact. Language including expression, naming, repetition, comprehension was assessed and found intact.   Cranial Nerves II - XII - II - Visual field intact OU. III, IV, VI - Extraocular movements  intact. V - Facial sensation intact bilaterally. VII - Facial movement intact bilaterally. VIII - Hearing & vestibular intact bilaterally. X - Palate elevates symmetrically. XI - Chin turning & shoulder shrug intact bilaterally. XII - Tongue protrusion intact.  Motor Strength - The patients strength was normal in all extremities and pronator drift was absent except mild left hand dexterity difficulty.  Bulk was normal and fasciculations were absent.   Motor Tone - Muscle tone was assessed at the neck and appendages and was normal. Reflexes - The patients reflexes were symmetrical in all extremities and she had no pathological reflexes. Sensory - Light touch, temperature/pinprick were assessed and were symmetrical except left thumb decreased sensation. Sensation is improving.    Coordination - The patient had normal movements in the hands and feet with no ataxia or dysmetria however, slow on the left finger-to-nose.  Tremor was absent. Gait and Station - walking around in room with IV pole.    ASSESSMENT/PLAN Ms. Veronica Small is a 71 y.o. female with history of hypertension, hyperlipidemia, anxiety admitted for left upper extremity weakness and numbness. No tPA given due to OOW.  Right carotid stenting on 1/26. Decrease in hemoglobin overnight, concern for pseudoaneurysm. VVS consulted by IR for management.    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   CTA head and neck x2 showed bilateral proximal ICA flow reducing stenosis.  Right ICA 80 to 90% stenosis and left ICA estimated at over 75%. MRI right MCA scattered infarcts Carotid Doppler bilateral ICA 60 to 79% stenosis LE venous Doppler no DVT 2D Echo EF 60 to 60% LDL 46 HgbA1c 5.9 UDS positive for benzo and amphentermine SCDs for VTE prophylaxis No antithrombotic prior to admission, now on aspirin 81 mg daily and Brilinta (ticagrelor) 90 mg bid. Off heparin  now. Patient counseled to be compliant  with her antithrombotic medications Ongoing aggressive stroke risk factor management Therapy recommendations: Pending Disposition: Pending  Carotid stenosis CTA head and neck x2 showed bilateral proximal ICA flow reducing stenosis.  Right ICA 80 to 90% stenosis and left ICA estimated at over 75%. Carotid Doppler bilateral ICA 60 to 79% stenosis Cerebral angiogram Severe Prox Rt ICA stenosis. Lt ICA prox 50 to 60 % stenosis associated with a small ulceration S/p right carotid stenting 1/26  Right femoral pseudoaneurysm Decreased HGB 11.1-> 8.3-> 9.4 Type and screen completed VVS consult- "extravasation has thrombosed with discontinuation of heparin" Right vein duplex- no evidence of pseudoaneurysm, AVF, or DVT Close monitoring  Hypertension Stable Avoid low BP BP goal normotensive  Hyperlipidemia Home meds: Pravastatin 40 LDL 46, goal < 70 Now on pravastatin 40 Continue statin at discharge  Other Active Problems Anxiety Depression Leukocytosis WBC 13.9->5.0->6.2   Hospital day # 4  Patient seen and examined by NP/APP with MD. MD to update note as needed.   Janine Ores, DNP, FNP-BC [facility] Pager: [phone]  ATTENDING ATTESTATION:  Right MCA CVA with Right ICA stenosis s/p stenting. Vascular consulted for Right femoral pseudoaneurysm -signed off. Seen by IR today. Ready to discharge with aspirin 81 mg and brilinta with out pt IR followup.  Dr. Reeves Forth evaluated pt independently, reviewed imaging, chart, labs. Discussed and formulated plan with the APP. Please see APP note above for details.   Total 30 minutes spent on counseling patient and coordinating care, writing notes and reviewing chart.   Libia Fazzini,MD    To contact Stroke Continuity provider, please refer to [email or web address] After hours, contact General Neurology

## 2021-12-25 NOTE — Progress Notes (Signed)
Physical Therapy Treatment Patient Details Name: Veronica Small MRN: [id number] DOB: 1951-06-21 Today's Date: 12/25/2021   History of Present Illness Veronica Small is a 71 y.o. female with PMH of anxiety/depression.  Pt presents with LUE weakness/numbness after reporting getting a cat scratch on her arm and needing to take Keflex. 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 s/p revascularization with stent 12/23/21. Concern for post op bleeding.    PT Comments    Plan per MD is to leave the hospital today. Pt has been ambulating to the bathroom independently, without anyone present and is able to manage her IV pole herself. During today's session DGI was performed and indicated that she is not a fall risk. Pt was a little unstable when performing toe taps on the step but was able to self correct without holding on to something or utilizing step strategy. Pt was educated on ways to minimize her fall risk around her house. During treatment session patient showed deficits in strength, endurance, activity tolerance. Currently not recommending further therapy services post acute due to return to baseline function.    Recommendations for follow up therapy are one component of a multi-disciplinary discharge planning process, led by the attending physician.  Recommendations may be updated based on patient status, additional functional criteria and insurance authorization.  Follow Up Recommendations  No PT follow up     Assistance Recommended at Discharge PRN  Patient can return home with the following Assist for transportation;Assistance with cooking/housework   Equipment Recommendations  None recommended by PT    Recommendations for Other Services       Precautions / Restrictions Precautions Precautions: Fall Restrictions Weight Bearing Restrictions: No     Mobility  Bed Mobility Overal bed mobility: Modified Independent Bed Mobility: Supine to Sit     Supine to sit:  Modified independent (Device/Increase time)     General bed mobility comments: No assist needed.    Transfers Overall transfer level: Modified independent Equipment used: None               General transfer comment: Patient was ambulating to the bathroom with IV pole upon entering the room.    Ambulation/Gait Ambulation/Gait assistance: Modified independent (Device/Increase time) Gait Distance (Feet): 250 Feet (standing breaks were taken) Assistive device: None Gait Pattern/deviations: Step-through pattern, Decreased stride length Gait velocity: decreased         Stairs   Stairs assistance: Modified independent (Device/Increase time) Stair Management: Alternating pattern, Forwards Number of Stairs: 2 (x2)     Wheelchair Mobility    Modified Rankin (Stroke Patients Only) Modified Rankin (Stroke Patients Only) Pre-Morbid Rankin Score: No symptoms Modified Rankin: Slight disability     Balance Overall balance assessment: Needs assistance   Sitting balance-Leahy Scale: Normal     Standing balance support: No upper extremity supported Standing balance-Leahy Scale: Good Standing balance comment: patient was ask to perform foot taps slowly onto the 6 inch step and exhibited mild balance impairments when standing on her left leg. She was able to compensate without holding on.                 Standardized Balance Assessment Standardized Balance Assessment : Dynamic Gait Index   Dynamic Gait Index Level Surface: Normal Change in Gait Speed: Normal Gait with Horizontal Head Turns: Mild Impairment Gait with Vertical Head Turns: Mild Impairment Gait and Pivot Turn: Normal Step Over Obstacle: Normal Step Around Obstacles: Normal Steps: Normal Total Score: 22  Cognition Arousal/Alertness: Awake/alert Behavior During Therapy: WFL for tasks assessed/performed Overall Cognitive Status: Within Functional Limits for tasks assessed                                           Exercises      General Comments        Pertinent Vitals/Pain Pain Assessment Pain Assessment: No/denies pain Pain Location: Reported having a headache earlier in the morning that resolved with standing and moving.    Home Living                          Prior Function            PT Goals (current goals can now be found in the care plan section)      Frequency    Min 4X/week      PT Plan Current plan remains appropriate    Co-evaluation              AM-PAC PT "6 Clicks" Mobility   Outcome Measure  Help needed turning from your back to your side while in a flat bed without using bedrails?: None Help needed moving from lying on your back to sitting on the side of a flat bed without using bedrails?: None Help needed moving to and from a bed to a chair (including a wheelchair)?: None Help needed standing up from a chair using your arms (e.g., wheelchair or bedside chair)?: None Help needed to walk in hospital room?: None Help needed climbing 3-5 steps with a railing? : A Little 6 Click Score: 23    End of Session Equipment Utilized During Treatment: Gait belt Activity Tolerance: Patient tolerated treatment well;No increased pain Patient left: in bed   PT Visit Diagnosis: Other abnormalities of gait and mobility (R26.89)     Time: 1025-1050    Charges:  23-37 minutes, Gait Training                    Armanda Heritage, SPT    Armanda Heritage 12/25/2021, 11:09 AM

## 2021-12-25 NOTE — Plan of Care (Signed)
  Problem: Self-Care: Goal: Ability to participate in self-care as condition permits will improve Outcome: Progressing   

## 2021-12-25 NOTE — Progress Notes (Signed)
°  Progress Note    12/25/2021 9:24 AM 2 Days Post-Op  Subjective: No complaints today, right groin without pain  Vitals:   12/25/21 0321 12/25/21 0825  BP: (!) 165/83 (!) 152/64  Pulse: 74 77  Resp: 17 16  Temp: 98.6 F (37 C) 99.3 F (37.4 C)  SpO2: 100% 97%    Physical Exam: Awake alert oriented Right groin is soft without hematoma Right foot is warm and well-perfused with palpable dorsalis pedis pulse  CBC    Component Value Date/Time   WBC 5.3 12/24/2021 0521   RBC 2.97 (L) 12/24/2021 0521   HGB 9.4 (L) 12/24/2021 1126   HCT 28.9 (L) 12/24/2021 1126   PLT 161 12/24/2021 0521   MCV 87.2 12/24/2021 0521   MCH 27.9 12/24/2021 0521   MCHC 32.0 12/24/2021 0521   RDW 12.5 12/24/2021 0521   LYMPHSABS 0.9 12/24/2021 0521   MONOABS 0.4 12/24/2021 0521   EOSABS 0.1 12/24/2021 0521   BASOSABS 0.0 12/24/2021 0521    BMET    Component Value Date/Time   NA 137 12/24/2021 0521   K 3.1 (L) 12/24/2021 0521   CL 109 12/24/2021 0521   CO2 22 12/24/2021 0521   GLUCOSE 129 (H) 12/24/2021 0521   BUN 5 (L) 12/24/2021 0521   CREATININE 0.82 12/24/2021 0521   CALCIUM 7.5 (L) 12/24/2021 0521   GFRNONAA >60 12/24/2021 0521   GFRAA >60 04/09/2016 0345    INR    Component Value Date/Time   INR 1.0 12/23/2021 0337     Intake/Output Summary (Last 24 hours) at 12/25/2021 0924 Last data filed at 12/24/2021 1600 Gross per 24 hour  Intake 618.11 ml  Output 850 ml  Net -231.89 ml     Assessment:  71 y.o. female is s/p right ICA stenting with postop concern for pseudoaneurysm right groin appears to have thrombosed with discontinuation of heparin.  Plan: Follow-up as needed with vascular surgery.   Jesper Stirewalt C. Randie Heinz, MD [facility] Office: [phone] Pager: [phone]  12/25/2021 9:24 AM

## 2021-12-25 NOTE — TOC Transition Note (Signed)
Transition of Care [facility]) - CM/SW Discharge Note   Patient Details  Name: Annett Boxwell MRN: [id number] Date of Birth: Mar 12, 1951  [facility] CM/SW Contact:  Lawerance Sabal, RN Phone Number: 12/25/2021, 10:41 AM   Clinical Narrative:    Patient provided with 30 day Brilinta card, verbalized understanding for how to use. No other TOC needs identified at this time      Barriers to Discharge: Continued Medical Work up   Patient Goals and CMS Choice     Choice offered to / list presented to : Patient  Discharge Placement                       Discharge Plan and Services   Discharge Planning Services: CM Consult                                 Social Determinants of Health (SDOH) Interventions     Readmission Risk Interventions No flowsheet data found.

## 2021-12-25 NOTE — Discharge Summary (Signed)
Physician Discharge Summary  Veronica Small ZOX:[id number] DOB: Mar 25, 1951 DOA: 12/20/2021  PCP: Renford Dills, MD  Admit date: 12/20/2021 Discharge date: 12/25/2021  Admitted From: Home Discharge disposition: Home   Code Status: Full Code   Discharge Diagnosis:   Principal Problem:   Sepsis (HCC) Active Problems:   Cerebral embolism with cerebral infarction   Internal carotid artery stenosis, right    Chief Complaint  Patient presents with   Medication Reaction   Brief narrative: Veronica Small is a 71 y.o. female with PMH significant for HLD, anxiety/depression, GERD who presented to the ED on 12/20/2021 with left upper extremity weakness, numbness and heaviness. She apparently was scratched on her right forearm by her cat a couple of days ago after which she developed a rash and pain.  She was started on a course of Keflex by her PCP.  Patient tends to relate her symptoms of weakness to Keflex.  In the ED, teleneurology was consulted. CT head did not show any acute intracranial pathology. Admitted for stroke work-up Neurology consulted. MRI brain showed numerous small acute microembolic infarcts in the right MCA territory with no sign of hemorrhage or mass-effect.  It also showed chronic small vessel ischemic changes CT angio of head and neck showed bilateral carotid artery stenosis with right proximal ICA narrowing 80 to 90% with the possibility of luminal clot. 1/26, patient underwent right ICA stenting by neuro IR Dr. Corliss Skains.  Subjective: Patient was seen and examined this morning.  Lying down in bed.  Not in distress.  No new symptoms.  Neuro IR team was at bedside as well.   Hemoglobin stable.  Patient feels comfortable going home today.  Assessment/Plan: Acute stroke -Presented with left upper extremity weakness -MRI brain with numerous small acute microembolic infarcts in the right MCA territory -Stroke work-up ongoing -Echo with EF 60 to 65%,, no atrial  level shunt. -LDL 46, A1c 5.9. -Prior to admission, patient was on pravastatin 40 mg daily. -Currently on aspirin 81 mg daily, Brilinta and pravastatin. -PT/OT eval eval obtained.  No follow-up recommended.  Bilateral carotid disease -With right ICA 80 to 90% stenosis -Vascular surgery consulted.   -1/26, patient underwent right ICA stenting by IR with right common femoral artery approach -Antiplatelets and statin as above  Acute blood loss anemia -Presented with hemoglobin more than 11.  Hemoglobin dropped to 8.3 next morning -CT scan showed active extravasation of blood from the common femoral artery which is at the site of catheter insertion for ICA stenting.  Seen by vascular surgery.  No further episode of bleeding.  No evidence of pseudoaneurysm.  No evidence of retroperitoneal hematoma. -Hemoglobin better at 9.4 on repeat check this morning. Recent Labs    12/21/21 0050 12/22/21 0526 12/23/21 0337 12/24/21 0521 12/24/21 1126  HGB 11.6* 10.7* 11.1* 8.3* 9.4*  MCV 87.2 85.6 84.1 87.2  --    Sepsis secondary to right upper extremity cellulitis Lactic acidosis -Patient was scratched on her right forearm by her cat a couple of days ago after which she developed a rash and pain.  She was started on a course of Keflex by her PCP.   -She presented with leukocytosis, significantly elevated lactic acid level.   -Antibiotics was switched to doxycycline on admission.  Completed a course of doxycycline -Cellulitis seems to be improving. -No fever, WBC count to repeat tomorrow.  Lactic acid level seem to have normalized -Follow-up blood culture report. Recent Labs  Lab 12/21/21 0050 12/21/21 0254 12/21/21  1644 12/21/21 1843 12/22/21 0345 12/22/21 0526 12/23/21 0337 12/24/21 0521  WBC 13.9*  --   --   --   --  5.0 6.2 5.3  LATICACIDVEN 3.6* 2.8* 1.1 1.2  --   --   --   --   PROCALCITON  --   --   --   --  0.53  --   --   --    Hyperlipidemia -Pravastatin  Anxiety/  Depression -Continue Xanax, Prozac, Abilify, trazodone  GERD -PPI  Mobility: Encourage ambulation Living condition: Lives at home Goals of care:   Code Status: Full Code  Nutritional status: Body mass index is 28.82 kg/m.       Discharge Medications:   Allergies as of 12/25/2021   No Known Allergies      Medication List     STOP taking these medications    ARIPiprazole 5 MG tablet Commonly known as: ABILIFY   cephALEXin 500 MG capsule Commonly known as: KEFLEX       TAKE these medications    ALPRAZolam 0.5 MG tablet Commonly known as: XANAX Take 0.5 mg by mouth 3 (three) times daily.   aspirin 81 MG chewable tablet Chew 1 tablet (81 mg total) by mouth daily. Start taking on: December 26, 2021   FLUoxetine 40 MG capsule Commonly known as: PROZAC Take 40 mg by mouth daily.   HYDROcodone-acetaminophen 5-325 MG tablet Commonly known as: NORCO/VICODIN Take 1 tablet by mouth every 6 (six) hours as needed for up to 3 days for moderate pain.   omeprazole 20 MG capsule Commonly known as: PRILOSEC Take 20 mg by mouth daily.   pravastatin 40 MG tablet Commonly known as: PRAVACHOL Take 40 mg by mouth daily.   ticagrelor 90 MG Tabs tablet Commonly known as: BRILINTA Take 1 tablet (90 mg total) by mouth 2 (two) times daily.   traZODone 150 MG tablet Commonly known as: DESYREL Take 300 mg by mouth at bedtime.        Wound care:     Discharge Instructions:   Discharge Instructions     Ambulatory referral to Occupational Therapy   Complete by: As directed    Call MD for:  difficulty breathing, headache or visual disturbances   Complete by: As directed    Call MD for:  extreme fatigue   Complete by: As directed    Call MD for:  hives   Complete by: As directed    Call MD for:  persistant dizziness or light-headedness   Complete by: As directed    Call MD for:  persistant nausea and vomiting   Complete by: As directed    Call MD for:  severe  uncontrolled pain   Complete by: As directed    Call MD for:  temperature >100.4   Complete by: As directed    Diet - low sodium heart healthy   Complete by: As directed    Discharge instructions   Complete by: As directed    General discharge instructions:  Follow with Primary MD Renford Dills, MD in 7 days   Get CBC/BMP checked in next visit within 1 week by PCP or SNF MD. (We routinely change or add medications that can affect your baseline labs and fluid status, therefore we recommend that you get the mentioned basic workup next visit with your PCP, your PCP may decide not to get them or add new tests based on their clinical decision)  On your next visit with your PCP, please  get your medicines reviewed and adjusted.  Please request your PCP  to go over all hospital tests, procedures, radiology results at the follow up, please get all Hospital records sent to your PCP by signing hospital release before you go home.  Activity: As tolerated with Full fall precautions use walker/cane & assistance as needed  Avoid using any recreational substances like cigarette, tobacco, alcohol, or non-prescribed drug.  If you experience worsening of your admission symptoms, develop shortness of breath, life threatening emergency, suicidal or homicidal thoughts you must seek medical attention immediately by calling 911 or calling your MD immediately  if symptoms less severe.  You must read complete instructions/literature along with all the possible adverse reactions/side effects for all the medicines you take and that have been prescribed to you. Take any new medicine only after you have completely understood and accepted all the possible adverse reactions/side effects.   Do not drive, operate heavy machinery, perform activities at heights, swimming or participation in water activities or provide baby sitting services if your were admitted for syncope or siezures until you have seen by Primary MD or a  Neurologist and advised to do so again.  Do not drive when taking Pain medications.  Do not take more than prescribed Pain, Sleep and Anxiety Medications  Wear Seat belts while driving.  Please note You were cared for by a hospitalist during your hospital stay. If you have any questions about your discharge medications or the care you received while you were in the hospital after you are discharged, you can call the unit and asked to speak with the hospitalist on call if the hospitalist that took care of you is not available. Once you are discharged, your primary care physician will handle any further medical issues. Please note that NO REFILLS for any discharge medications will be authorized once you are discharged, as it is imperative that you return to your primary care physician (or establish a relationship with a primary care physician if you do not have one) for your aftercare needs so that they can reassess your need for medications and monitor your lab values.   Increase activity slowly   Complete by: As directed        Follow ups:    Follow-up Information     [facility]. Schedule an appointment as soon as possible for a visit in 1 week(s).   Specialty: Rehabilitation Contact information: [id number] [id number] [place] [id number]        Julieanne Cottoneveshwar, Sanjeev, MD Follow up in 3 week(s).   Specialties: Interventional Radiology, Radiology Why: please call [phone] nwith any questions or concerns; pt will hear from scheduler for NIR follow up 2-3 weeks Contact information: [address] [place] [address] [place] [address] [phone]         Renford DillsPolite, Ronald, MD Follow up.   Specialty: Internal Medicine Contact information: [address] [place] [address] [phone]                 Discharge Exam:   Vitals:   12/24/21 2302 12/24/21 2344  12/25/21 0321 12/25/21 0825  BP:  (!) 150/70 (!) 165/83 (!) 152/64  Pulse:  64 74 77  Resp:  17 17 16   Temp:  98 F (36.7 C) 98.6 F (37 C) 99.3 F (37.4 C)  TempSrc:  Oral Oral Oral  SpO2:  99% 100% 97%  Weight: 81 kg  Height: 5\' 6"  (1.676 m)       Body mass index is 28.82 kg/m.  General exam: Pleasant, elderly Caucasian female.  Not in physical distress Skin: No rashes, lesions or ulcers. HEENT: Atraumatic, normocephalic, no obvious bleeding Lungs: Clear to auscultation bilaterally CVS: Regular rate and rhythm, no murmur GI/Abd soft, nontender, nondistended, bowel sound present CNS: Alert, awake, oriented x3, Psychiatry: Mood appropriate Extremities: No pedal edema, no calf tenderness.  Right forearm cellulitis is definitely improving.  Time coordinating discharge: 35 minutes   The results of significant diagnostics from this hospitalization (including imaging, microbiology, ancillary and laboratory) are listed below for reference.    Procedures and Diagnostic Studies:   CT Angio Head W or Wo Contrast  Result Date: 12/21/2021 CLINICAL DATA:  Stroke follow-up.  Near syncope with slurred speech EXAM: CT ANGIOGRAPHY HEAD AND NECK TECHNIQUE: Multidetector CT imaging of the head and neck was performed using the standard protocol during bolus administration of intravenous contrast. Multiplanar CT image reconstructions and MIPs were obtained to evaluate the vascular anatomy. Carotid stenosis measurements (when applicable) are obtained utilizing NASCET criteria, using the distal internal carotid diameter as the denominator. RADIATION DOSE REDUCTION: This exam was performed according to the departmental dose-optimization program which includes automated exposure control, adjustment of the mA and/or kV according to patient size and/or use of iterative reconstruction technique. CONTRAST:  80mL OMNIPAQUE IOHEXOL 350 MG/ML SOLN COMPARISON:  Head CT from earlier today FINDINGS: CTA NECK  FINDINGS Aortic arch: Atheromatous calcification with 3 vessel branching. Right carotid system: Atheromatous plaque, heavily calcified, at the bifurcation/proximal ICA but the lumen cannot be measured/assessed given the degree of laryngeal motion. Flow reducing stenosis is definitely possible. No downstream stenosis or beading. Left carotid system: Atheromatous plaque especially heavy calcification at the bifurcation and proximal ICA. Due to laryngeal artifact the lumen cannot be assessed/measured. Possible flow limiting stenosis. No evidence of dissection. Vertebral arteries: Proximal left subclavian atheromatous plaque. No flow limiting stenosis. Dominant left vertebral artery. Both vertebral arteries are smoothly contoured and widely patent to the dura. Skeleton: Cervical spine degeneration especially affecting left-sided facets. Other neck: No acute finding.  Motion artifact Upper chest: Clear apical lungs. Review of the MIP images confirms the above findings CTA HEAD FINDINGS Anterior circulation: Atheromatous calcification of the carotid siphons. No branch occlusion, beading, or aneurysm. Posterior circulation: Vertebral and basilar arteries are smoothly contoured and widely patent. Left vertebral artery is dominant right vertebral artery ending in PICA. No branch occlusion, beading, or aneurysm. Venous sinuses: Diffusely patent Anatomic variants: None significant Review of the MIP images confirms the above findings IMPRESSION: 1. No emergent finding. No evidence of vertebrobasilar insufficiency. 2. Most notable atheromatous plaque is at the bilateral carotid bifurcation. Nondiagnostic assessment of associated stenoses given patient motion, flow reducing stenosis could be present on either side and follow-up for carotid Doppler or repeat CTA recommended. Electronically Signed   By: Tiburcio PeaJonathan  Watts M.D.   On: 12/21/2021 06:11   CT HEAD WO CONTRAST  Result Date: 12/21/2021 CLINICAL DATA:  Transient ischemic  attack. EXAM: CT HEAD WITHOUT CONTRAST TECHNIQUE: Contiguous axial images were obtained from the base of the skull through the vertex without intravenous contrast. RADIATION DOSE REDUCTION: This exam was performed according to the departmental dose-optimization program which includes automated exposure control, adjustment of the mA and/or kV according to patient size and/or use of iterative reconstruction technique. COMPARISON:  Head CT dated 01/02/2009. FINDINGS: Brain: Mild age-related atrophy and chronic microvascular ischemic changes. There is no  acute intracranial hemorrhage. No mass effect or midline shift. No extra-axial fluid collection. Vascular: No hyperdense vessel or unexpected calcification. Skull: Normal. Negative for fracture or focal lesion. Sinuses/Orbits: There is mucoperiosteal thickening of paranasal sinuses. No air-fluid level. The mastoid air cells are clear. Other: None IMPRESSION: 1. No acute intracranial pathology. 2. Mild age-related atrophy and chronic microvascular ischemic changes. Electronically Signed   By: Elgie Collard M.D.   On: 12/21/2021 01:35   CT Angio Neck W and/or Wo Contrast  Result Date: 12/21/2021 CLINICAL DATA:  Stroke follow-up.  Near syncope with slurred speech EXAM: CT ANGIOGRAPHY HEAD AND NECK TECHNIQUE: Multidetector CT imaging of the head and neck was performed using the standard protocol during bolus administration of intravenous contrast. Multiplanar CT image reconstructions and MIPs were obtained to evaluate the vascular anatomy. Carotid stenosis measurements (when applicable) are obtained utilizing NASCET criteria, using the distal internal carotid diameter as the denominator. RADIATION DOSE REDUCTION: This exam was performed according to the departmental dose-optimization program which includes automated exposure control, adjustment of the mA and/or kV according to patient size and/or use of iterative reconstruction technique. CONTRAST:  80mL OMNIPAQUE  IOHEXOL 350 MG/ML SOLN COMPARISON:  Head CT from earlier today FINDINGS: CTA NECK FINDINGS Aortic arch: Atheromatous calcification with 3 vessel branching. Right carotid system: Atheromatous plaque, heavily calcified, at the bifurcation/proximal ICA but the lumen cannot be measured/assessed given the degree of laryngeal motion. Flow reducing stenosis is definitely possible. No downstream stenosis or beading. Left carotid system: Atheromatous plaque especially heavy calcification at the bifurcation and proximal ICA. Due to laryngeal artifact the lumen cannot be assessed/measured. Possible flow limiting stenosis. No evidence of dissection. Vertebral arteries: Proximal left subclavian atheromatous plaque. No flow limiting stenosis. Dominant left vertebral artery. Both vertebral arteries are smoothly contoured and widely patent to the dura. Skeleton: Cervical spine degeneration especially affecting left-sided facets. Other neck: No acute finding.  Motion artifact Upper chest: Clear apical lungs. Review of the MIP images confirms the above findings CTA HEAD FINDINGS Anterior circulation: Atheromatous calcification of the carotid siphons. No branch occlusion, beading, or aneurysm. Posterior circulation: Vertebral and basilar arteries are smoothly contoured and widely patent. Left vertebral artery is dominant right vertebral artery ending in PICA. No branch occlusion, beading, or aneurysm. Venous sinuses: Diffusely patent Anatomic variants: None significant Review of the MIP images confirms the above findings IMPRESSION: 1. No emergent finding. No evidence of vertebrobasilar insufficiency. 2. Most notable atheromatous plaque is at the bilateral carotid bifurcation. Nondiagnostic assessment of associated stenoses given patient motion, flow reducing stenosis could be present on either side and follow-up for carotid Doppler or repeat CTA recommended. Electronically Signed   By: Tiburcio Pea M.D.   On: 12/21/2021 06:11    MR BRAIN WO CONTRAST  Result Date: 12/21/2021 CLINICAL DATA:  Neuro deficit, acute, stroke suspected. Near syncopal episode. Tremors. Slurred speech. Facial droop. EXAM: MRI HEAD WITHOUT CONTRAST TECHNIQUE: Multiplanar, multiecho pulse sequences of the brain and surrounding structures were obtained without intravenous contrast. COMPARISON:  CT studies earlier same day.  MRI 01/03/2009. FINDINGS: Brain: No abnormality affects the brainstem or cerebellum. Left cerebral hemisphere shows minimal chronic small-vessel change of the white matter. Right hemisphere shows numerous acute infarctions affecting the cortical brain in the right middle cerebral artery territory consistent with micro embolic infarctions. No large or confluent infarction. No mass effect or hemorrhage. Mild chronic small-vessel change affects the deep white matter of the right hemisphere as well as the left. No hydrocephalus or extra-axial  collection. No mass lesion. Vascular: Major vessels at the base of the brain show flow. 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 No large confluent infarction. No sign of hemorrhage or mass effect. Otherwise, the brain has a good appearance for age, with only mild chronic small-vessel ischemic changes of the cerebral hemispheric white matter. Electronically Signed   By: Paulina Fusi M.D.   On: 12/21/2021 10:09   ECHOCARDIOGRAM COMPLETE  Result Date: 12/21/2021    ECHOCARDIOGRAM REPORT   Patient Name:   RHYLIN VENTERS Date of Exam: 12/21/2021 Medical Rec #:  [id number]        Height:       66.0 in Accession #:    [id number]       Weight:       177.0 lb Date of Birth:  Sep 17, 1951        BSA:          1.899 m Patient Age:    70 years         BP:           156/84 mmHg Patient Gender: F                HR:           75 bpm. Exam Location:  Inpatient Procedure: 2D  Echo, Cardiac Doppler and Color Doppler Indications:    Stroke  History:        Patient has no prior history of Echocardiogram examinations.                 Risk Factors:Former Smoker.  Sonographer:    Vanetta Shawl Referring Phys: [id number] Delila Pereyra A KYLE IMPRESSIONS  1. Left ventricular ejection fraction, by estimation, is 60 to 65%. The left ventricle has normal function. The left ventricle has no regional wall motion abnormalities. Left ventricular diastolic parameters were normal.  2. Right ventricular systolic function is normal. The right ventricular size is normal. Tricuspid regurgitation signal is inadequate for assessing PA pressure.  3. The mitral valve is normal in structure. No evidence of mitral valve regurgitation. No evidence of mitral stenosis.  4. The aortic valve was not well visualized. Aortic valve regurgitation is not visualized. No aortic stenosis is present. Comparison(s): No prior Echocardiogram. Conclusion(s)/Recommendation(s): No intracardiac source of embolism detected on this transthoracic study. Consider a transesophageal echocardiogram to exclude cardiac source of embolism if clinically indicated. FINDINGS  Left Ventricle: Left ventricular ejection fraction, by estimation, is 60 to 65%. The left ventricle has normal function. The left ventricle has no regional wall motion abnormalities. The left ventricular internal cavity size was normal in size. There is  no left ventricular hypertrophy. Left ventricular diastolic parameters were normal. Right Ventricle: Calcified papillary muscle. The right ventricular size is normal. No increase in right ventricular wall thickness. Right ventricular systolic function is normal. Tricuspid regurgitation signal is inadequate for assessing PA pressure. Left Atrium: Left atrial size was normal in size. Right Atrium: Right atrial size was normal in size. Pericardium: There is no evidence of pericardial effusion. Mitral Valve: The mitral valve is normal in  structure. No evidence of mitral valve regurgitation. No evidence of mitral valve stenosis. Tricuspid Valve: The tricuspid valve is normal in structure. Tricuspid valve regurgitation is not demonstrated. No evidence of tricuspid stenosis. Aortic Valve: The aortic valve was not well visualized. Aortic valve regurgitation is not visualized. No aortic stenosis  is present. Aortic valve mean gradient measures 4.5 mmHg. Aortic valve peak gradient measures 8.4 mmHg. Aortic valve area, by VTI measures 2.01 cm. Pulmonic Valve: The pulmonic valve was normal in structure. Pulmonic valve regurgitation is not visualized. No evidence of pulmonic stenosis. Aorta: The aortic root and ascending aorta are structurally normal, with no evidence of dilitation. Venous: The inferior vena cava was not well visualized. IAS/Shunts: No atrial level shunt detected by color flow Doppler.  LEFT VENTRICLE PLAX 2D LVIDd:         4.70 cm   Diastology LVIDs:         3.10 cm   LV e' medial:    9.46 cm/s LV PW:         1.00 cm   LV E/e' medial:  11.7 LV IVS:        1.00 cm   LV e' lateral:   10.60 cm/s LVOT diam:     1.80 cm   LV E/e' lateral: 10.5 LV SV:         66 LV SV Index:   35 LVOT Area:     2.54 cm  RIGHT VENTRICLE RV S prime:     12.30 cm/s LEFT ATRIUM           Index LA diam:      4.30 cm 2.26 cm/m LA Vol (A2C): 37.4 ml 19.70 ml/m  AORTIC VALVE                     PULMONIC VALVE AV Area (Vmax):    2.02 cm      PV Vmax:       1.13 m/s AV Area (Vmean):   2.08 cm      PV Peak grad:  5.1 mmHg AV Area (VTI):     2.01 cm AV Vmax:           145.00 cm/s AV Vmean:          100.400 cm/s AV VTI:            0.328 m AV Peak Grad:      8.4 mmHg AV Mean Grad:      4.5 mmHg LVOT Vmax:         115.00 cm/s LVOT Vmean:        81.900 cm/s LVOT VTI:          0.259 m LVOT/AV VTI ratio: 0.79  AORTA Ao Root diam: 2.90 cm Ao Asc diam:  3.00 cm MITRAL VALVE MV Area (PHT): 5.09 cm     SHUNTS MV Decel Time: 149 msec     Systemic VTI:  0.26 m MV E velocity:  111.00 cm/s  Systemic Diam: 1.80 cm MV A velocity: 123.00 cm/s MV E/A ratio:  0.90 Riley Lam MD Electronically signed by Riley Lam MD Signature Date/Time: 12/21/2021/3:03:59 PM    Final      Labs:   Basic Metabolic Panel: Recent Labs  Lab 12/21/21 0050 12/22/21 0345 12/23/21 0337 12/24/21 0521  NA 137 137 136 137  K 3.8 3.5 3.8 3.1*  CL 107 108 109 109  CO2 21* 20* 22 22  GLUCOSE 175* 111* 106* 129*  BUN 14 8 8  5*  CREATININE 1.11* 0.91 0.94 0.82  CALCIUM 8.0* 8.0* 8.0* 7.5*   GFR Estimated Creatinine Clearance: 68.5 mL/min (by C-G formula based on SCr of 0.82 mg/dL). Liver Function Tests: Recent Labs  Lab 12/21/21 0050 12/22/21 0345  AST 27 21  ALT 18 17  ALKPHOS 77 57  BILITOT 0.5 0.5  PROT 6.2* 5.1*  ALBUMIN 3.4* 2.8*   No results for input(s): LIPASE, AMYLASE in the last 168 hours. No results for input(s): AMMONIA in the last 168 hours. Coagulation profile Recent Labs  Lab 12/21/21 0050 12/22/21 0345 12/23/21 0337  INR 1.1 1.1 1.0    CBC: Recent Labs  Lab 12/21/21 0050 12/22/21 0526 12/23/21 0337 12/24/21 0521 12/24/21 1126  WBC 13.9* 5.0 6.2 5.3  --   NEUTROABS 12.6*  --  4.4 3.9  --   HGB 11.6* 10.7* 11.1* 8.3* 9.4*  HCT 36.8 32.8* 33.8* 25.9* 28.9*  MCV 87.2 85.6 84.1 87.2  --   PLT 210 173 203 161  --    Cardiac Enzymes: No results for input(s): CKTOTAL, CKMB, CKMBINDEX, TROPONINI in the last 168 hours. BNP: Invalid input(s): POCBNP CBG: Recent Labs  Lab 12/21/21 0735  GLUCAP 133*   D-Dimer No results for input(s): DDIMER in the last 72 hours. Hgb A1c No results for input(s): HGBA1C in the last 72 hours. Lipid Profile No results for input(s): CHOL, HDL, LDLCALC, TRIG, CHOLHDL, LDLDIRECT in the last 72 hours. Thyroid function studies No results for input(s): TSH, T4TOTAL, T3FREE, THYROIDAB in the last 72 hours.  Invalid input(s): FREET3 Anemia work up No results for input(s): VITAMINB12, FOLATE, FERRITIN,  TIBC, IRON, RETICCTPCT in the last 72 hours. Microbiology Recent Results (from the past 240 hour(s))  Resp Panel by RT-PCR (Flu A&B, Covid) Nasopharyngeal Swab     Status: None   Collection Time: 12/21/21  1:30 AM   Specimen: Nasopharyngeal Swab; Nasopharyngeal(NP) swabs in vial transport medium  Result Value Ref Range Status   SARS Coronavirus 2 by RT PCR NEGATIVE NEGATIVE Final    Comment: (NOTE) SARS-CoV-2 target nucleic acids are NOT DETECTED.  The SARS-CoV-2 RNA is generally detectable in upper respiratory specimens during the acute phase of infection. The lowest concentration of SARS-CoV-2 viral copies this assay can detect is 138 copies/mL. A negative result does not preclude SARS-Cov-2 infection and should not be used as the sole basis for treatment or other patient management decisions. A negative result may occur with  improper specimen collection/handling, submission of specimen other than nasopharyngeal swab, presence of viral mutation(s) within the areas targeted by this assay, and inadequate number of viral copies(<138 copies/mL). A negative result must be combined with clinical observations, patient history, and epidemiological information. The expected result is Negative.  Fact Sheet for Patients:  BloggerCourse.com  Fact Sheet for Healthcare Providers:  SeriousBroker.it  This test is no t yet approved or cleared by the [place] FDA and  has been authorized for detection and/or diagnosis of SARS-CoV-2 by FDA under an Emergency Use Authorization (EUA). This EUA will remain  in effect (meaning this test can be used) for the duration of the COVID-19 declaration under Section 564(b)(1) of the Act, 21 U.S.C.section 360bbb-3(b)(1), unless the authorization is terminated  or revoked sooner.       Influenza A by PCR NEGATIVE NEGATIVE Final   Influenza B by PCR NEGATIVE NEGATIVE Final    Comment: (NOTE) The Xpert  Xpress SARS-CoV-2/FLU/RSV plus assay is intended as an aid in the diagnosis of influenza from Nasopharyngeal swab specimens and should not be used as a sole basis for treatment. Nasal washings and aspirates are unacceptable for Xpert Xpress SARS-CoV-2/FLU/RSV testing.  Fact Sheet for Patients: BloggerCourse.com  Fact Sheet for Healthcare Providers: SeriousBroker.it  This test is not yet approved or cleared by the [place] FDA and has been authorized for detection and/or diagnosis of SARS-CoV-2 by FDA under an Emergency Use Authorization (EUA). This EUA will remain in effect (meaning this test can be used) for the duration of the COVID-19 declaration under Section 564(b)(1) of the Act, 21 U.S.C. section 360bbb-3(b)(1), unless the authorization is terminated or revoked.  Performed at [facility], [address], [place], [place] [address]   Blood culture (routine x 2)     Status: None (Preliminary result)   Collection Time: 12/21/21  2:28 AM   Specimen: BLOOD LEFT ARM  Result Value Ref Range Status   Specimen Description   Final    BLOOD LEFT ARM Performed at [facility], [address] [place], [place] [address]    Special Requests   Final    BOTTLES DRAWN AEROBIC AND ANAEROBIC Blood Culture results may not be optimal due to an inadequate volume of blood received in culture bottles Performed at [facility], [address], [place], [place] [address]    Culture   Final    NO GROWTH 4 DAYS Performed at [facility], [address], [place], [place] [address]    Report Status PENDING  Incomplete  Blood culture (routine x 2)     Status: None (Preliminary result)   Collection Time: 12/21/21  2:55 AM   Specimen: BLOOD  Result Value Ref Range Status   Specimen Description   Final    BLOOD RIGHT ANTECUBITAL Performed at [facility], [address],  [place], [place] [address]    Special Requests   Final    BOTTLES DRAWN AEROBIC AND ANAEROBIC Blood Culture adequate volume Performed at [facility], [address] [place], [place] [address]    Culture   Final    NO GROWTH 4 DAYS Performed at [facility], [address], [place], [place] [address]    Report Status PENDING  Incomplete  Surgical pcr screen     Status: None   Collection Time: 12/23/21  4:19 AM   Specimen: Nasal Mucosa; Nasal Swab  Result Value Ref Range Status   MRSA, PCR NEGATIVE NEGATIVE Final   Staphylococcus aureus NEGATIVE NEGATIVE Final    Comment: (NOTE) The Xpert SA Assay (FDA approved for NASAL specimens in patients 23 years of age and older), is one component of a comprehensive surveillance program. It is not intended to diagnose infection nor to guide or monitor treatment. Performed at [facility], [address], [place], [place] [address]   MRSA Next Gen by PCR, Nasal     Status: None   Collection Time: 12/23/21  2:35 PM   Specimen: Nasal Mucosa; Nasal Swab  Result Value Ref Range Status   MRSA by PCR Next Gen NOT DETECTED NOT DETECTED Final    Comment: (NOTE) The GeneXpert MRSA Assay (FDA approved for NASAL specimens only), is one component of a comprehensive MRSA colonization surveillance program. It is not intended to diagnose MRSA infection nor to guide or monitor treatment for MRSA infections. Test performance is not FDA approved in patients less than 52 years old. Performed at [facility], [address], [place], [place] [address]      Signed: Lorin Glass  [facility] 12/25/2021, 9:54 AM

## 2021-12-26 LAB — CULTURE, BLOOD (ROUTINE X 2)
Culture: NO GROWTH
Culture: NO GROWTH
Special Requests: ADEQUATE

## 2021-12-27 HISTORY — PX: IR CT HEAD LTD: IMG2386

## 2022-01-20 ENCOUNTER — Other Ambulatory Visit: Payer: Self-pay

## 2022-01-20 ENCOUNTER — Ambulatory Visit (HOSPITAL_COMMUNITY)
Admission: RE | Admit: 2022-01-20 | Discharge: 2022-01-20 | Disposition: A | Discharge status: Home / Self Care | Payer: No Typology Code available for payment source | Source: Ambulatory Visit | Attending: Radiology | Admitting: Radiology

## 2022-01-20 DIAGNOSIS — I6521 Occlusion and stenosis of right carotid artery: Secondary | ICD-10-CM

## 2022-01-23 HISTORY — PX: IR RADIOLOGIST EVAL & MGMT: IMG5224

## 2022-02-01 ENCOUNTER — Other Ambulatory Visit: Payer: Self-pay | Admitting: Internal Medicine

## 2022-02-01 ENCOUNTER — Ambulatory Visit
Admission: RE | Admit: 2022-02-01 | Discharge: 2022-02-01 | Disposition: A | Discharge status: Home / Self Care | Payer: Medicare Other | Source: Ambulatory Visit | Attending: Internal Medicine | Admitting: Internal Medicine

## 2022-02-01 DIAGNOSIS — M549 Dorsalgia, unspecified: Secondary | ICD-10-CM

## 2022-02-01 DIAGNOSIS — M25559 Pain in unspecified hip: Secondary | ICD-10-CM

## 2022-03-01 DIAGNOSIS — M25559 Pain in unspecified hip: Secondary | ICD-10-CM | POA: Diagnosis not present

## 2022-03-01 DIAGNOSIS — I6523 Occlusion and stenosis of bilateral carotid arteries: Secondary | ICD-10-CM | POA: Diagnosis not present

## 2022-03-01 DIAGNOSIS — E7800 Pure hypercholesterolemia, unspecified: Secondary | ICD-10-CM | POA: Diagnosis not present

## 2022-03-01 DIAGNOSIS — I63131 Cerebral infarction due to embolism of right carotid artery: Secondary | ICD-10-CM | POA: Diagnosis not present

## 2022-04-01 DIAGNOSIS — L03114 Cellulitis of left upper limb: Secondary | ICD-10-CM | POA: Diagnosis not present

## 2022-04-19 DIAGNOSIS — R059 Cough, unspecified: Secondary | ICD-10-CM | POA: Diagnosis not present

## 2022-04-19 DIAGNOSIS — E7800 Pure hypercholesterolemia, unspecified: Secondary | ICD-10-CM | POA: Diagnosis not present

## 2022-07-07 ENCOUNTER — Telehealth (HOSPITAL_COMMUNITY): Payer: Self-pay

## 2022-07-07 NOTE — Telephone Encounter (Signed)
Called to schedule us carotid, no answer, left vm. AW  

## 2022-09-19 DIAGNOSIS — Z9181 History of falling: Secondary | ICD-10-CM | POA: Diagnosis not present

## 2022-09-19 DIAGNOSIS — E7800 Pure hypercholesterolemia, unspecified: Secondary | ICD-10-CM | POA: Diagnosis not present

## 2022-09-19 DIAGNOSIS — K219 Gastro-esophageal reflux disease without esophagitis: Secondary | ICD-10-CM | POA: Diagnosis not present

## 2022-09-19 DIAGNOSIS — Z23 Encounter for immunization: Secondary | ICD-10-CM | POA: Diagnosis not present

## 2022-09-19 DIAGNOSIS — Z79899 Other long term (current) drug therapy: Secondary | ICD-10-CM | POA: Diagnosis not present

## 2022-09-19 DIAGNOSIS — I6340 Cerebral infarction due to embolism of unspecified cerebral artery: Secondary | ICD-10-CM | POA: Diagnosis not present

## 2022-09-20 ENCOUNTER — Other Ambulatory Visit (HOSPITAL_COMMUNITY): Payer: Self-pay | Admitting: Interventional Radiology

## 2022-09-20 DIAGNOSIS — I771 Stricture of artery: Secondary | ICD-10-CM

## 2022-10-03 ENCOUNTER — Ambulatory Visit (HOSPITAL_COMMUNITY)
Admission: RE | Admit: 2022-10-03 | Discharge: 2022-10-03 | Disposition: A | Discharge status: Home / Self Care | Payer: Medicare Other | Source: Ambulatory Visit | Attending: Interventional Radiology | Admitting: Interventional Radiology

## 2022-10-03 DIAGNOSIS — I771 Stricture of artery: Secondary | ICD-10-CM | POA: Insufficient documentation

## 2022-10-03 NOTE — Progress Notes (Signed)
VASCULAR LAB    Carotid duplex has been performed.  See CV proc for preliminary results.   Abagale Boulos, RVT 10/03/2022, 4:51 PM

## 2022-11-29 DIAGNOSIS — R051 Acute cough: Secondary | ICD-10-CM | POA: Diagnosis not present

## 2022-11-29 DIAGNOSIS — J019 Acute sinusitis, unspecified: Secondary | ICD-10-CM | POA: Diagnosis not present

## 2022-11-30 DIAGNOSIS — I10 Essential (primary) hypertension: Secondary | ICD-10-CM | POA: Diagnosis not present

## 2022-11-30 DIAGNOSIS — R0600 Dyspnea, unspecified: Secondary | ICD-10-CM | POA: Diagnosis not present

## 2022-11-30 DIAGNOSIS — A419 Sepsis, unspecified organism: Secondary | ICD-10-CM | POA: Diagnosis not present

## 2022-11-30 DIAGNOSIS — R0902 Hypoxemia: Secondary | ICD-10-CM | POA: Diagnosis not present

## 2022-12-01 DIAGNOSIS — I700 Atherosclerosis of aorta: Secondary | ICD-10-CM | POA: Diagnosis not present

## 2022-12-01 DIAGNOSIS — E860 Dehydration: Secondary | ICD-10-CM | POA: Diagnosis not present

## 2022-12-01 DIAGNOSIS — Z87891 Personal history of nicotine dependence: Secondary | ICD-10-CM | POA: Diagnosis not present

## 2022-12-01 DIAGNOSIS — J90 Pleural effusion, not elsewhere classified: Secondary | ICD-10-CM | POA: Diagnosis not present

## 2022-12-01 DIAGNOSIS — Z452 Encounter for adjustment and management of vascular access device: Secondary | ICD-10-CM | POA: Diagnosis not present

## 2022-12-01 DIAGNOSIS — I468 Cardiac arrest due to other underlying condition: Secondary | ICD-10-CM | POA: Diagnosis not present

## 2022-12-01 DIAGNOSIS — N170 Acute kidney failure with tubular necrosis: Secondary | ICD-10-CM | POA: Diagnosis not present

## 2022-12-01 DIAGNOSIS — I272 Pulmonary hypertension, unspecified: Secondary | ICD-10-CM | POA: Diagnosis not present

## 2022-12-01 DIAGNOSIS — J9690 Respiratory failure, unspecified, unspecified whether with hypoxia or hypercapnia: Secondary | ICD-10-CM | POA: Diagnosis not present

## 2022-12-01 DIAGNOSIS — R6521 Severe sepsis with septic shock: Secondary | ICD-10-CM | POA: Diagnosis not present

## 2022-12-01 DIAGNOSIS — G9341 Metabolic encephalopathy: Secondary | ICD-10-CM | POA: Diagnosis not present

## 2022-12-01 DIAGNOSIS — J9601 Acute respiratory failure with hypoxia: Secondary | ICD-10-CM | POA: Diagnosis not present

## 2022-12-01 DIAGNOSIS — K5900 Constipation, unspecified: Secondary | ICD-10-CM | POA: Diagnosis not present

## 2022-12-01 DIAGNOSIS — Z8673 Personal history of transient ischemic attack (TIA), and cerebral infarction without residual deficits: Secondary | ICD-10-CM | POA: Diagnosis not present

## 2022-12-01 DIAGNOSIS — Z66 Do not resuscitate: Secondary | ICD-10-CM | POA: Diagnosis not present

## 2022-12-01 DIAGNOSIS — Z79899 Other long term (current) drug therapy: Secondary | ICD-10-CM | POA: Diagnosis not present

## 2022-12-01 DIAGNOSIS — R739 Hyperglycemia, unspecified: Secondary | ICD-10-CM | POA: Diagnosis not present

## 2022-12-01 DIAGNOSIS — E785 Hyperlipidemia, unspecified: Secondary | ICD-10-CM | POA: Diagnosis not present

## 2022-12-01 DIAGNOSIS — Z7982 Long term (current) use of aspirin: Secondary | ICD-10-CM | POA: Diagnosis not present

## 2022-12-01 DIAGNOSIS — Z9989 Dependence on other enabling machines and devices: Secondary | ICD-10-CM | POA: Diagnosis not present

## 2022-12-01 DIAGNOSIS — R079 Chest pain, unspecified: Secondary | ICD-10-CM | POA: Diagnosis not present

## 2022-12-01 DIAGNOSIS — R0602 Shortness of breath: Secondary | ICD-10-CM | POA: Diagnosis not present

## 2022-12-01 DIAGNOSIS — I361 Nonrheumatic tricuspid (valve) insufficiency: Secondary | ICD-10-CM | POA: Diagnosis not present

## 2022-12-01 DIAGNOSIS — E876 Hypokalemia: Secondary | ICD-10-CM | POA: Diagnosis not present

## 2022-12-01 DIAGNOSIS — R0603 Acute respiratory distress: Secondary | ICD-10-CM | POA: Diagnosis not present

## 2022-12-01 DIAGNOSIS — Z7902 Long term (current) use of antithrombotics/antiplatelets: Secondary | ICD-10-CM | POA: Diagnosis not present

## 2022-12-01 DIAGNOSIS — Z9911 Dependence on respirator [ventilator] status: Secondary | ICD-10-CM | POA: Diagnosis not present

## 2022-12-01 DIAGNOSIS — R9431 Abnormal electrocardiogram [ECG] [EKG]: Secondary | ICD-10-CM | POA: Diagnosis not present

## 2022-12-01 DIAGNOSIS — E873 Alkalosis: Secondary | ICD-10-CM | POA: Diagnosis not present

## 2022-12-01 DIAGNOSIS — R0902 Hypoxemia: Secondary | ICD-10-CM | POA: Diagnosis not present

## 2022-12-01 DIAGNOSIS — K219 Gastro-esophageal reflux disease without esophagitis: Secondary | ICD-10-CM | POA: Diagnosis not present

## 2022-12-01 DIAGNOSIS — Z1152 Encounter for screening for COVID-19: Secondary | ICD-10-CM | POA: Diagnosis not present

## 2022-12-01 DIAGNOSIS — R918 Other nonspecific abnormal finding of lung field: Secondary | ICD-10-CM | POA: Diagnosis not present

## 2022-12-01 DIAGNOSIS — J9600 Acute respiratory failure, unspecified whether with hypoxia or hypercapnia: Secondary | ICD-10-CM | POA: Diagnosis not present

## 2022-12-01 DIAGNOSIS — Z515 Encounter for palliative care: Secondary | ICD-10-CM | POA: Diagnosis not present

## 2022-12-01 DIAGNOSIS — Z4682 Encounter for fitting and adjustment of non-vascular catheter: Secondary | ICD-10-CM | POA: Diagnosis not present

## 2022-12-01 DIAGNOSIS — I2721 Secondary pulmonary arterial hypertension: Secondary | ICD-10-CM | POA: Diagnosis not present

## 2022-12-01 DIAGNOSIS — I517 Cardiomegaly: Secondary | ICD-10-CM | POA: Diagnosis not present

## 2022-12-01 DIAGNOSIS — A419 Sepsis, unspecified organism: Secondary | ICD-10-CM | POA: Diagnosis not present

## 2022-12-02 DIAGNOSIS — J9690 Respiratory failure, unspecified, unspecified whether with hypoxia or hypercapnia: Secondary | ICD-10-CM | POA: Diagnosis not present

## 2022-12-02 DIAGNOSIS — I361 Nonrheumatic tricuspid (valve) insufficiency: Secondary | ICD-10-CM | POA: Diagnosis not present

## 2022-12-02 DIAGNOSIS — I2721 Secondary pulmonary arterial hypertension: Secondary | ICD-10-CM | POA: Diagnosis not present

## 2022-12-02 DIAGNOSIS — I517 Cardiomegaly: Secondary | ICD-10-CM | POA: Diagnosis not present

## 2022-12-02 DIAGNOSIS — J9601 Acute respiratory failure with hypoxia: Secondary | ICD-10-CM | POA: Diagnosis not present

## 2022-12-02 DIAGNOSIS — I700 Atherosclerosis of aorta: Secondary | ICD-10-CM | POA: Diagnosis not present

## 2022-12-02 DIAGNOSIS — Z9911 Dependence on respirator [ventilator] status: Secondary | ICD-10-CM | POA: Diagnosis not present

## 2022-12-02 DIAGNOSIS — R918 Other nonspecific abnormal finding of lung field: Secondary | ICD-10-CM | POA: Diagnosis not present

## 2022-12-02 DIAGNOSIS — Z4682 Encounter for fitting and adjustment of non-vascular catheter: Secondary | ICD-10-CM | POA: Diagnosis not present

## 2022-12-03 DIAGNOSIS — R918 Other nonspecific abnormal finding of lung field: Secondary | ICD-10-CM | POA: Diagnosis not present

## 2022-12-03 DIAGNOSIS — J9690 Respiratory failure, unspecified, unspecified whether with hypoxia or hypercapnia: Secondary | ICD-10-CM | POA: Diagnosis not present

## 2022-12-04 DIAGNOSIS — Z4682 Encounter for fitting and adjustment of non-vascular catheter: Secondary | ICD-10-CM | POA: Diagnosis not present

## 2022-12-04 DIAGNOSIS — R918 Other nonspecific abnormal finding of lung field: Secondary | ICD-10-CM | POA: Diagnosis not present

## 2022-12-05 DIAGNOSIS — Z4682 Encounter for fitting and adjustment of non-vascular catheter: Secondary | ICD-10-CM | POA: Diagnosis not present

## 2022-12-05 DIAGNOSIS — R918 Other nonspecific abnormal finding of lung field: Secondary | ICD-10-CM | POA: Diagnosis not present

## 2022-12-05 DIAGNOSIS — Z9911 Dependence on respirator [ventilator] status: Secondary | ICD-10-CM | POA: Diagnosis not present

## 2022-12-05 DIAGNOSIS — J9601 Acute respiratory failure with hypoxia: Secondary | ICD-10-CM | POA: Diagnosis not present

## 2022-12-05 DIAGNOSIS — I2721 Secondary pulmonary arterial hypertension: Secondary | ICD-10-CM | POA: Diagnosis not present

## 2022-12-06 DIAGNOSIS — J9690 Respiratory failure, unspecified, unspecified whether with hypoxia or hypercapnia: Secondary | ICD-10-CM | POA: Diagnosis not present

## 2022-12-06 DIAGNOSIS — J90 Pleural effusion, not elsewhere classified: Secondary | ICD-10-CM | POA: Diagnosis not present

## 2022-12-06 DIAGNOSIS — I2721 Secondary pulmonary arterial hypertension: Secondary | ICD-10-CM | POA: Diagnosis not present

## 2022-12-06 DIAGNOSIS — R079 Chest pain, unspecified: Secondary | ICD-10-CM | POA: Diagnosis not present

## 2022-12-06 DIAGNOSIS — Z9911 Dependence on respirator [ventilator] status: Secondary | ICD-10-CM | POA: Diagnosis not present

## 2022-12-06 DIAGNOSIS — J9601 Acute respiratory failure with hypoxia: Secondary | ICD-10-CM | POA: Diagnosis not present

## 2022-12-07 DIAGNOSIS — A419 Sepsis, unspecified organism: Secondary | ICD-10-CM | POA: Diagnosis not present

## 2022-12-07 DIAGNOSIS — Z9911 Dependence on respirator [ventilator] status: Secondary | ICD-10-CM | POA: Diagnosis not present

## 2022-12-07 DIAGNOSIS — J9600 Acute respiratory failure, unspecified whether with hypoxia or hypercapnia: Secondary | ICD-10-CM | POA: Diagnosis not present

## 2022-12-07 DIAGNOSIS — J90 Pleural effusion, not elsewhere classified: Secondary | ICD-10-CM | POA: Diagnosis not present

## 2022-12-07 DIAGNOSIS — J9601 Acute respiratory failure with hypoxia: Secondary | ICD-10-CM | POA: Diagnosis not present

## 2022-12-07 DIAGNOSIS — J9690 Respiratory failure, unspecified, unspecified whether with hypoxia or hypercapnia: Secondary | ICD-10-CM | POA: Diagnosis not present

## 2022-12-07 DIAGNOSIS — I2721 Secondary pulmonary arterial hypertension: Secondary | ICD-10-CM | POA: Diagnosis not present

## 2022-12-07 DIAGNOSIS — Z452 Encounter for adjustment and management of vascular access device: Secondary | ICD-10-CM | POA: Diagnosis not present

## 2022-12-07 DIAGNOSIS — R918 Other nonspecific abnormal finding of lung field: Secondary | ICD-10-CM | POA: Diagnosis not present

## 2022-12-08 DIAGNOSIS — J90 Pleural effusion, not elsewhere classified: Secondary | ICD-10-CM | POA: Diagnosis not present

## 2022-12-08 DIAGNOSIS — J9690 Respiratory failure, unspecified, unspecified whether with hypoxia or hypercapnia: Secondary | ICD-10-CM | POA: Diagnosis not present

## 2022-12-08 DIAGNOSIS — J9601 Acute respiratory failure with hypoxia: Secondary | ICD-10-CM | POA: Diagnosis not present

## 2022-12-08 DIAGNOSIS — I2721 Secondary pulmonary arterial hypertension: Secondary | ICD-10-CM | POA: Diagnosis not present

## 2022-12-08 DIAGNOSIS — R079 Chest pain, unspecified: Secondary | ICD-10-CM | POA: Diagnosis not present

## 2022-12-08 DIAGNOSIS — Z9911 Dependence on respirator [ventilator] status: Secondary | ICD-10-CM | POA: Diagnosis not present

## 2022-12-08 DIAGNOSIS — A419 Sepsis, unspecified organism: Secondary | ICD-10-CM | POA: Diagnosis not present

## 2022-12-09 DIAGNOSIS — A419 Sepsis, unspecified organism: Secondary | ICD-10-CM | POA: Diagnosis not present

## 2022-12-09 DIAGNOSIS — Z9911 Dependence on respirator [ventilator] status: Secondary | ICD-10-CM | POA: Diagnosis not present

## 2022-12-09 DIAGNOSIS — J9601 Acute respiratory failure with hypoxia: Secondary | ICD-10-CM | POA: Diagnosis not present

## 2022-12-09 DIAGNOSIS — J9690 Respiratory failure, unspecified, unspecified whether with hypoxia or hypercapnia: Secondary | ICD-10-CM | POA: Diagnosis not present

## 2022-12-09 DIAGNOSIS — I2721 Secondary pulmonary arterial hypertension: Secondary | ICD-10-CM | POA: Diagnosis not present

## 2022-12-10 DIAGNOSIS — A419 Sepsis, unspecified organism: Secondary | ICD-10-CM | POA: Diagnosis not present

## 2022-12-10 DIAGNOSIS — J9601 Acute respiratory failure with hypoxia: Secondary | ICD-10-CM | POA: Diagnosis not present

## 2022-12-10 DIAGNOSIS — J9690 Respiratory failure, unspecified, unspecified whether with hypoxia or hypercapnia: Secondary | ICD-10-CM | POA: Diagnosis not present

## 2022-12-10 DIAGNOSIS — R918 Other nonspecific abnormal finding of lung field: Secondary | ICD-10-CM | POA: Diagnosis not present

## 2022-12-11 DIAGNOSIS — A419 Sepsis, unspecified organism: Secondary | ICD-10-CM | POA: Diagnosis not present

## 2022-12-11 DIAGNOSIS — J9601 Acute respiratory failure with hypoxia: Secondary | ICD-10-CM | POA: Diagnosis not present

## 2022-12-12 DIAGNOSIS — J9601 Acute respiratory failure with hypoxia: Secondary | ICD-10-CM | POA: Diagnosis not present

## 2022-12-12 DIAGNOSIS — I2721 Secondary pulmonary arterial hypertension: Secondary | ICD-10-CM | POA: Diagnosis not present

## 2022-12-12 DIAGNOSIS — A419 Sepsis, unspecified organism: Secondary | ICD-10-CM | POA: Diagnosis not present

## 2022-12-12 DIAGNOSIS — R0603 Acute respiratory distress: Secondary | ICD-10-CM | POA: Diagnosis not present

## 2022-12-12 DIAGNOSIS — Z9911 Dependence on respirator [ventilator] status: Secondary | ICD-10-CM | POA: Diagnosis not present

## 2022-12-13 DIAGNOSIS — J9601 Acute respiratory failure with hypoxia: Secondary | ICD-10-CM | POA: Diagnosis not present

## 2022-12-13 DIAGNOSIS — J9690 Respiratory failure, unspecified, unspecified whether with hypoxia or hypercapnia: Secondary | ICD-10-CM | POA: Diagnosis not present

## 2022-12-13 DIAGNOSIS — Z9911 Dependence on respirator [ventilator] status: Secondary | ICD-10-CM | POA: Diagnosis not present

## 2022-12-13 DIAGNOSIS — I2721 Secondary pulmonary arterial hypertension: Secondary | ICD-10-CM | POA: Diagnosis not present

## 2022-12-13 DIAGNOSIS — A419 Sepsis, unspecified organism: Secondary | ICD-10-CM | POA: Diagnosis not present

## 2022-12-13 DIAGNOSIS — Z4682 Encounter for fitting and adjustment of non-vascular catheter: Secondary | ICD-10-CM | POA: Diagnosis not present

## 2022-12-13 DIAGNOSIS — J90 Pleural effusion, not elsewhere classified: Secondary | ICD-10-CM | POA: Diagnosis not present

## 2022-12-14 DIAGNOSIS — J90 Pleural effusion, not elsewhere classified: Secondary | ICD-10-CM | POA: Diagnosis not present

## 2022-12-14 DIAGNOSIS — J9601 Acute respiratory failure with hypoxia: Secondary | ICD-10-CM | POA: Diagnosis not present

## 2022-12-14 DIAGNOSIS — A419 Sepsis, unspecified organism: Secondary | ICD-10-CM | POA: Diagnosis not present

## 2022-12-14 DIAGNOSIS — I2721 Secondary pulmonary arterial hypertension: Secondary | ICD-10-CM | POA: Diagnosis not present

## 2022-12-14 DIAGNOSIS — Z9911 Dependence on respirator [ventilator] status: Secondary | ICD-10-CM | POA: Diagnosis not present

## 2022-12-15 DIAGNOSIS — I2721 Secondary pulmonary arterial hypertension: Secondary | ICD-10-CM | POA: Diagnosis not present

## 2022-12-15 DIAGNOSIS — Z9911 Dependence on respirator [ventilator] status: Secondary | ICD-10-CM | POA: Diagnosis not present

## 2022-12-15 DIAGNOSIS — A419 Sepsis, unspecified organism: Secondary | ICD-10-CM | POA: Diagnosis not present

## 2022-12-15 DIAGNOSIS — J9601 Acute respiratory failure with hypoxia: Secondary | ICD-10-CM | POA: Diagnosis not present

## 2022-12-16 DIAGNOSIS — J9690 Respiratory failure, unspecified, unspecified whether with hypoxia or hypercapnia: Secondary | ICD-10-CM | POA: Diagnosis not present

## 2022-12-16 DIAGNOSIS — Z9911 Dependence on respirator [ventilator] status: Secondary | ICD-10-CM | POA: Diagnosis not present

## 2022-12-16 DIAGNOSIS — J9601 Acute respiratory failure with hypoxia: Secondary | ICD-10-CM | POA: Diagnosis not present

## 2022-12-16 DIAGNOSIS — I2721 Secondary pulmonary arterial hypertension: Secondary | ICD-10-CM | POA: Diagnosis not present

## 2022-12-16 DIAGNOSIS — A419 Sepsis, unspecified organism: Secondary | ICD-10-CM | POA: Diagnosis not present

## 2022-12-17 DIAGNOSIS — J9690 Respiratory failure, unspecified, unspecified whether with hypoxia or hypercapnia: Secondary | ICD-10-CM | POA: Diagnosis not present

## 2022-12-17 DIAGNOSIS — A419 Sepsis, unspecified organism: Secondary | ICD-10-CM | POA: Diagnosis not present

## 2022-12-17 DIAGNOSIS — J9601 Acute respiratory failure with hypoxia: Secondary | ICD-10-CM | POA: Diagnosis not present

## 2022-12-22 IMAGING — CR DG LUMBAR SPINE 2-3V
3 series · 3 of 3 positions shown · non-contrast
Comparison: 0862

CLINICAL DATA: Back pain, unspecified back location, unspecified
back pain laterality, unspecified chronicity

EXAM:
LUMBAR SPINE - 2-3 VIEW

[t l-spine a.p.]
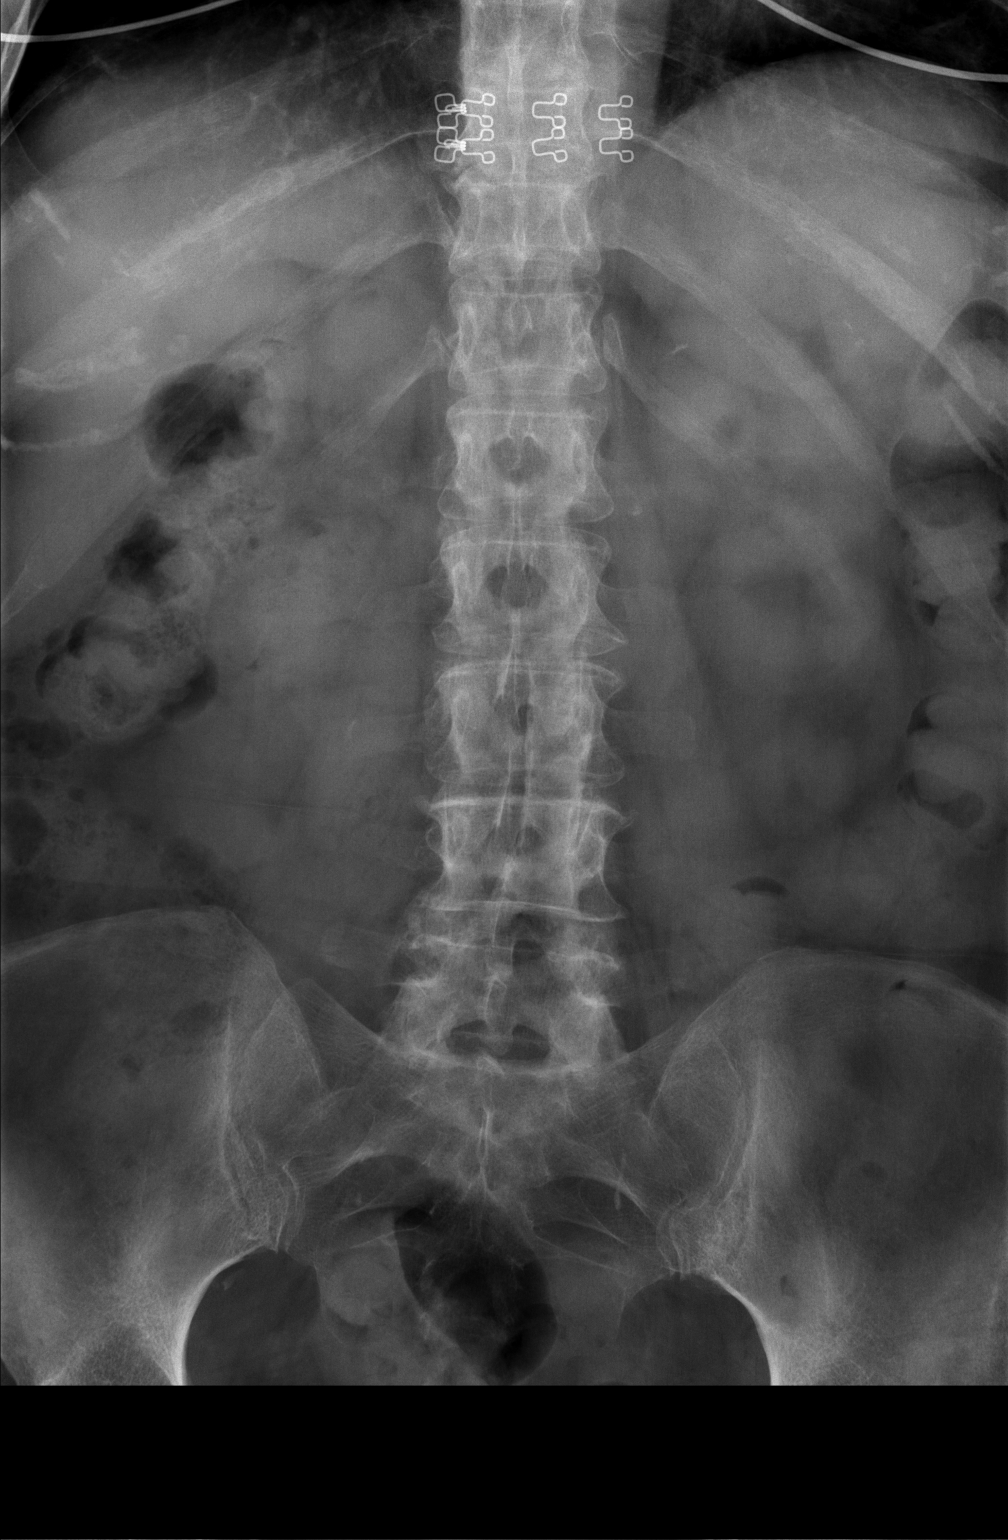

[t l-spine lat]
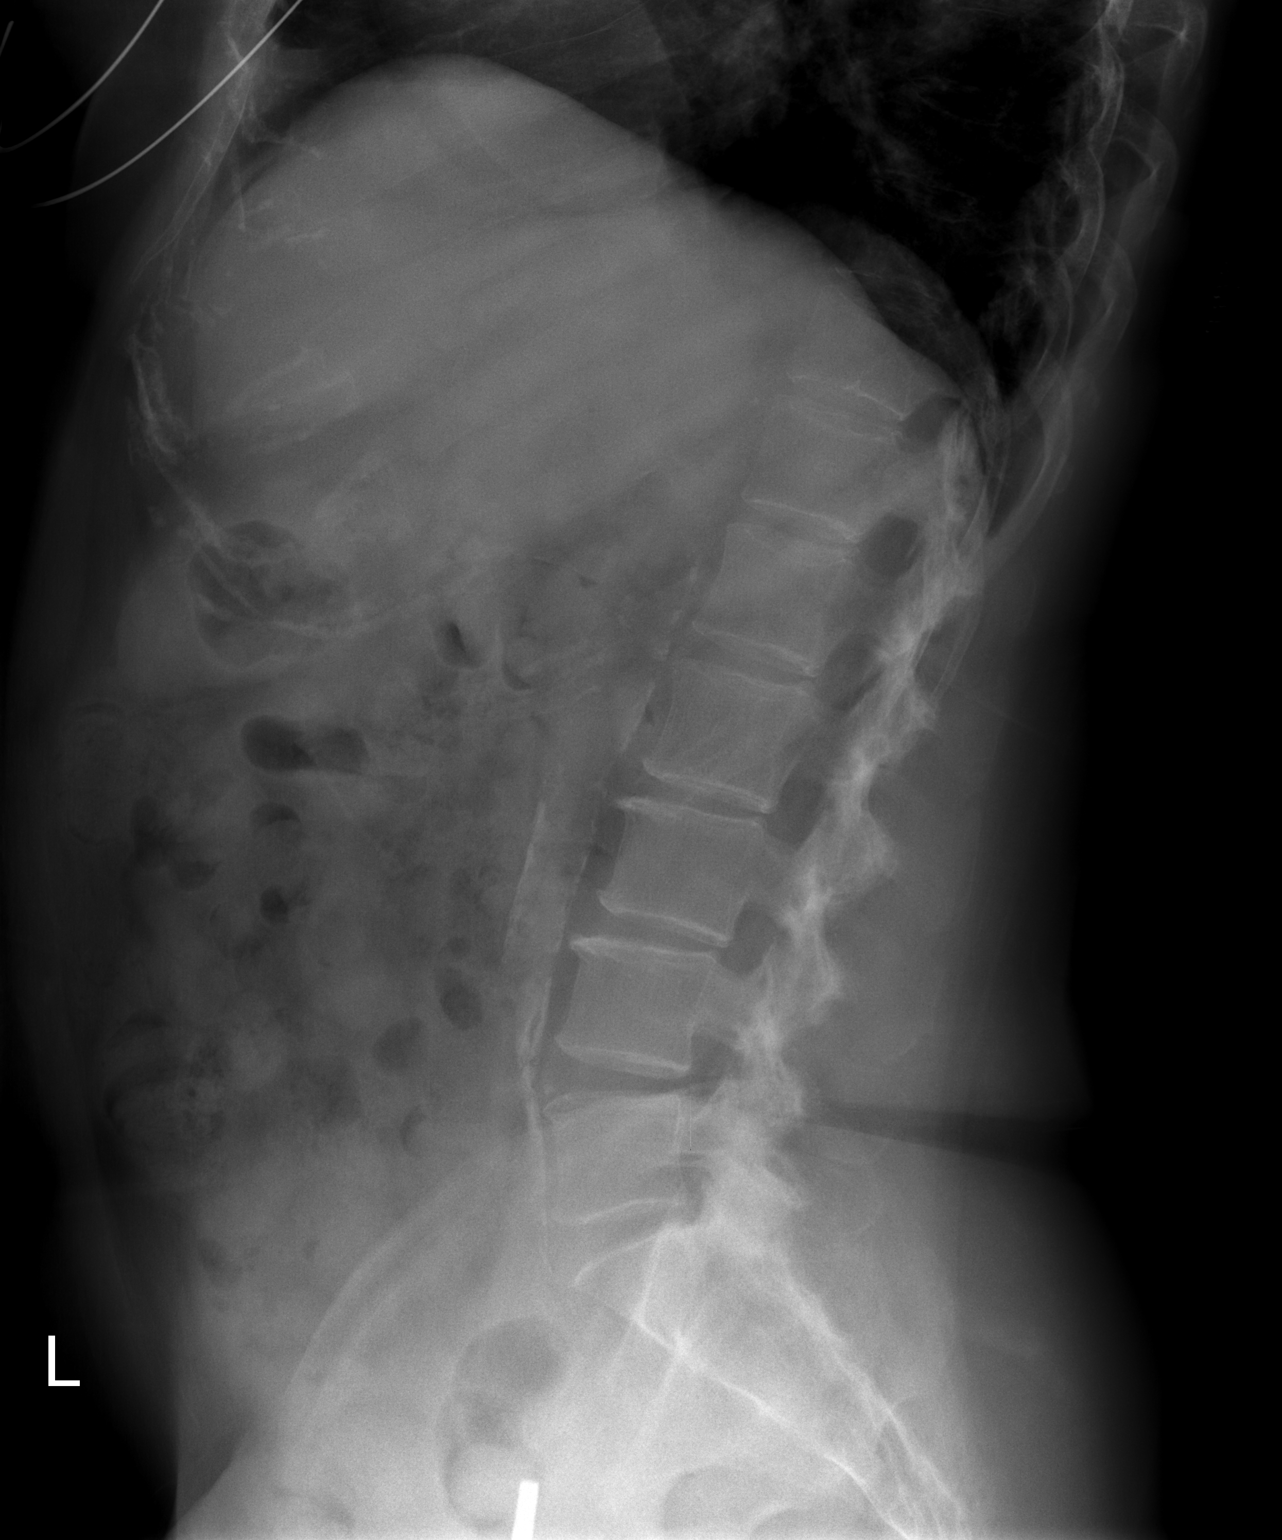

[t l-spine l5-s1 spot]
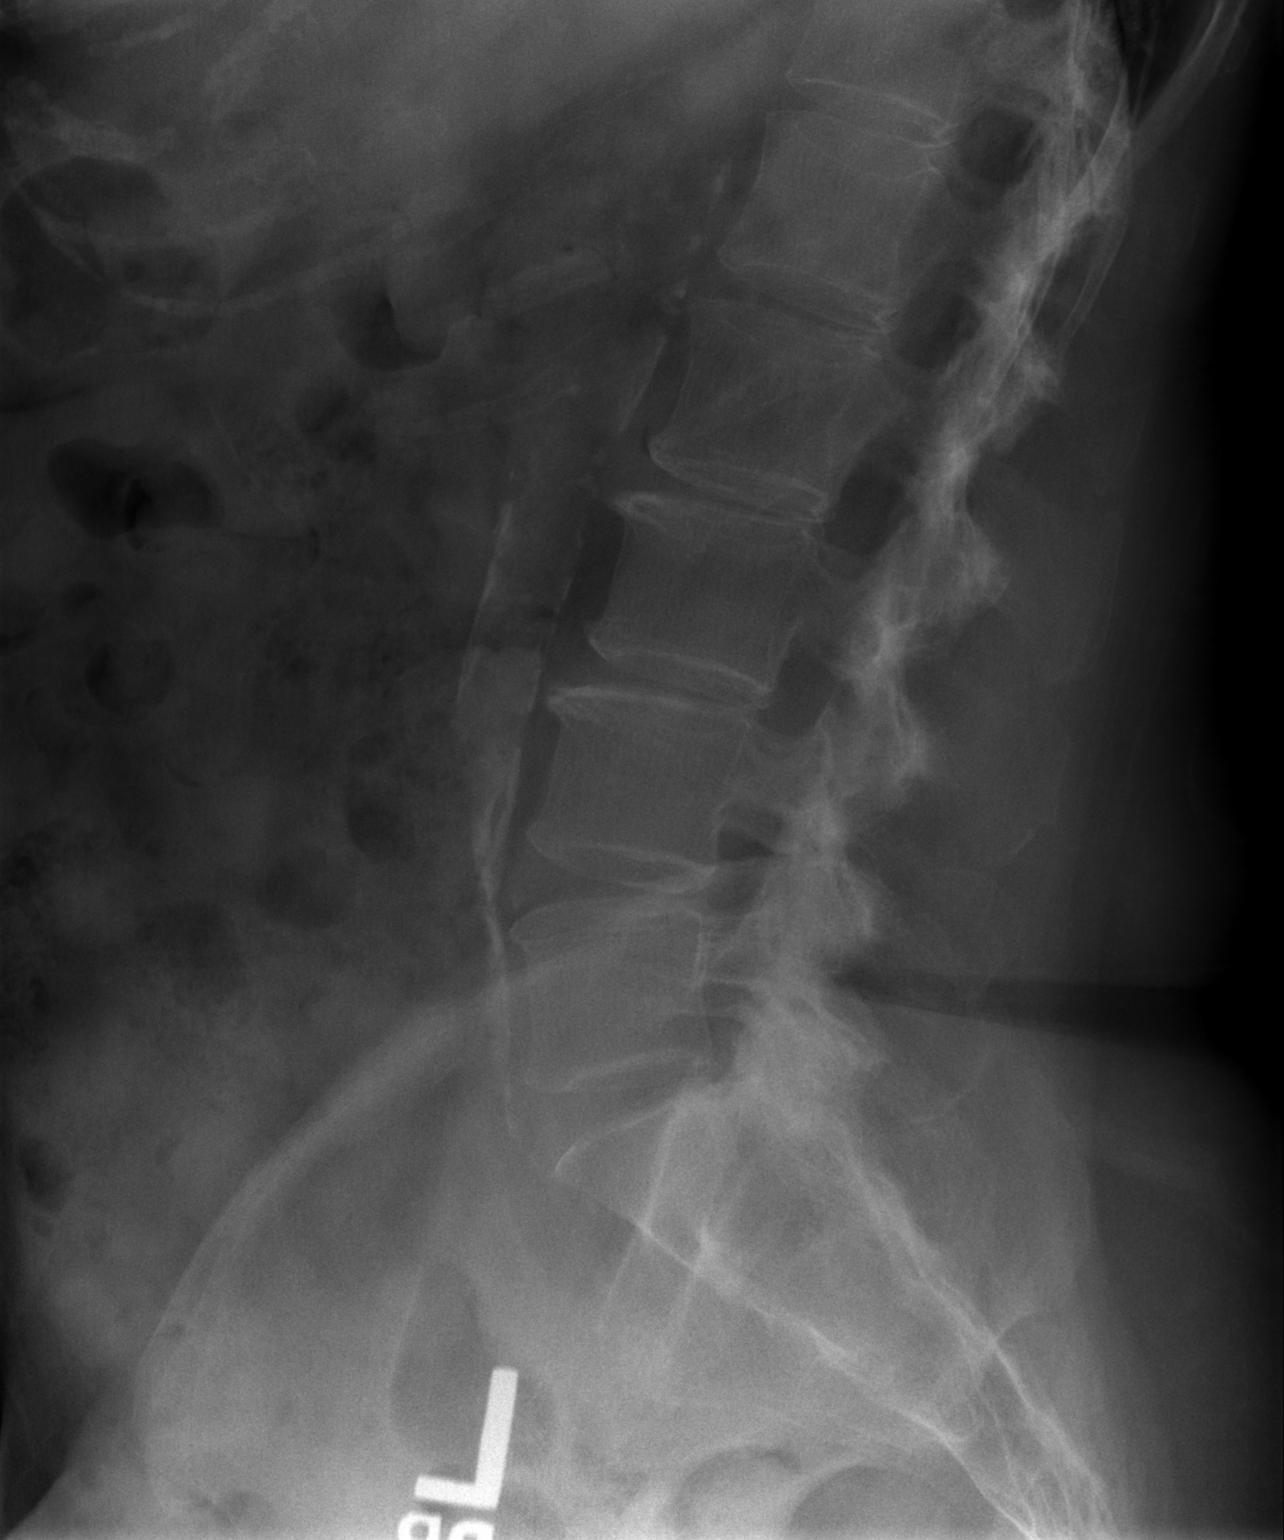

[3 of 3 positions shown; findings below may reference images not displayed]

FINDINGS: Five lumbar type vertebral bodies. Vertebral body heights and
alignment are maintained. There is diffuse disc space narrowing.
Lower lumbar facet hypertrophy. Aortic atherosclerosis.
IMPRESSION: Mild to moderate lumbar spondylosis with progression since [DATE].

## 2022-12-29 DEATH — deceased

## 2024-05-25 ENCOUNTER — Encounter (HOSPITAL_COMMUNITY): Payer: Self-pay | Admitting: Interventional Radiology
